# Patient Record
Sex: Female | Born: 1945
Health system: Southern US, Community
[De-identification: ages and names within clinical notes are randomized; demographics above are authoritative.]

## PROBLEM LIST (undated history)

## (undated) DIAGNOSIS — N819 Female genital prolapse, unspecified: Secondary | ICD-10-CM

## (undated) DIAGNOSIS — N816 Rectocele: Secondary | ICD-10-CM

## (undated) DIAGNOSIS — K649 Unspecified hemorrhoids: Secondary | ICD-10-CM

## (undated) DIAGNOSIS — N993 Prolapse of vaginal vault after hysterectomy: Secondary | ICD-10-CM

## (undated) DIAGNOSIS — R32 Unspecified urinary incontinence: Secondary | ICD-10-CM

## (undated) DIAGNOSIS — E7800 Pure hypercholesterolemia, unspecified: Secondary | ICD-10-CM

## (undated) HISTORY — PX: FOOT SURGERY: SHX648

## (undated) HISTORY — DX: Unspecified urinary incontinence: R32

## (undated) HISTORY — PX: FRACTURE SURGERY: SHX138

## (undated) HISTORY — PX: TONSILECTOMY, ADENOIDECTOMY, BILATERAL MYRINGOTOMY AND TUBES: SHX2538

## (undated) HISTORY — DX: Prolapse of vaginal vault after hysterectomy: N99.3

## (undated) HISTORY — DX: Unspecified hemorrhoids: K64.9

## (undated) HISTORY — DX: Female genital prolapse, unspecified: N81.9

## (undated) HISTORY — PX: ABDOMINAL HYSTERECTOMY: SHX81

## (undated) HISTORY — DX: Pure hypercholesterolemia, unspecified: E78.00

## (undated) HISTORY — DX: Rectocele: N81.6

---

## 1998-06-10 ENCOUNTER — Other Ambulatory Visit: Admission: RE | Admit: 1998-06-10 | Discharge: 1998-06-10 | Payer: Self-pay | Admitting: *Deleted

## 1999-07-20 ENCOUNTER — Other Ambulatory Visit: Admission: RE | Admit: 1999-07-20 | Discharge: 1999-07-20 | Payer: Self-pay | Admitting: *Deleted

## 2002-05-01 ENCOUNTER — Other Ambulatory Visit: Admission: RE | Admit: 2002-05-01 | Discharge: 2002-05-01 | Payer: Self-pay | Admitting: *Deleted

## 2002-05-28 ENCOUNTER — Encounter: Payer: Self-pay | Admitting: Oral Surgery

## 2002-05-28 ENCOUNTER — Ambulatory Visit (HOSPITAL_COMMUNITY): Admission: RE | Admit: 2002-05-28 | Discharge: 2002-05-28 | Payer: Self-pay | Admitting: Oral Surgery

## 2003-06-10 ENCOUNTER — Other Ambulatory Visit: Admission: RE | Admit: 2003-06-10 | Discharge: 2003-06-10 | Payer: Self-pay | Admitting: *Deleted

## 2004-07-01 ENCOUNTER — Ambulatory Visit: Payer: Self-pay | Admitting: Internal Medicine

## 2004-07-01 ENCOUNTER — Ambulatory Visit (HOSPITAL_COMMUNITY): Admission: RE | Admit: 2004-07-01 | Discharge: 2004-07-01 | Payer: Self-pay | Admitting: Internal Medicine

## 2004-07-01 HISTORY — PX: COLONOSCOPY: SHX174

## 2004-07-27 ENCOUNTER — Other Ambulatory Visit: Admission: RE | Admit: 2004-07-27 | Discharge: 2004-07-27 | Payer: Self-pay | Admitting: *Deleted

## 2005-05-22 ENCOUNTER — Ambulatory Visit (HOSPITAL_COMMUNITY): Admission: RE | Admit: 2005-05-22 | Discharge: 2005-05-22 | Payer: Self-pay | Admitting: Internal Medicine

## 2005-11-13 ENCOUNTER — Other Ambulatory Visit: Admission: RE | Admit: 2005-11-13 | Discharge: 2005-11-13 | Payer: Self-pay | Admitting: *Deleted

## 2007-05-21 ENCOUNTER — Ambulatory Visit (HOSPITAL_COMMUNITY): Admission: RE | Admit: 2007-05-21 | Discharge: 2007-05-21 | Payer: Self-pay | Admitting: Internal Medicine

## 2010-07-22 NOTE — Op Note (Signed)
NAME:  Cathy Mclean, Cathy Mclean             ACCOUNT NO.:  [id number]   MEDICAL RECORD NO.:  [id number]          PATIENT TYPE:  AMB   LOCATION:  DAY                           FACILITY:  [facility]   PHYSICIAN:  R. Roetta Sessions, M.D. DATE OF BIRTH:  [birth year]   DATE OF PROCEDURE:  07/01/2004  DATE OF DISCHARGE:                                 OPERATIVE REPORT   PROCEDURE:  Screening colonoscopy.   INDICATION FOR PROCEDURE:  The patient is a 65 year old lady who was sent  over by courtesy of Kingsley Callander. Ouida Sills, MD, for colorectal cancer screening.  She  has no family history of colorectal neoplasia.  She has no GI symptoms.  She  had a sigmoidoscopy by Dr. Ouida Sills several years ago without significant  findings.  Colonoscopy is now being done as a screening maneuver.  This  approach has been discussed with the patient at length, the potential risks,  benefits, and alternatives have been reviewed, questions answered.  She is  agreeable.  Please see documentation in the medical record.   PROCEDURE NOTE:  O2 saturation, blood pressure, pulse, and respiration were  monitored throughout the entire procedure.   CONSCIOUS SEDATION:  Versed 3.5 mg IV, Demerol 75 mg IV in divided doses.   INSTRUMENT USED:  Olympus video chip system.   FINDINGS:  Digital rectal exam revealed no abnormalities.  Endoscopic  findings:  The prep was good.   Rectum:  Examination of the rectal mucosa including a retroflexed view of  the anal verge revealed no abnormalities.   Colon:  The colonic mucosa was surveyed from the rectosigmoid junction  through the left, transverse and right colon to the area of the appendiceal  orifice, ileocecal valve and cecum.  These structures were well-seen and  photographed for the record.  From this level the scope was slowly withdrawn  and all previously-mentioned mucosal surfaces were again seen.  The patient  was noted to have left-sided diverticula.  The remainder of the colonic  mucosa  appeared normal.  The patient tolerated the procedure well and was  reacted in endoscopy.   IMPRESSION:  1.  Normal rectum.  2.  Left-sided diverticula, the remainder of the colonic mucosa appeared      normal.   RECOMMENDATIONS:  1.  Diverticulosis literature provided to [name].  2.  Repeat colonoscopy 10 years.      RMR/MEDQ  D:  07/01/2004  T:  07/01/2004  Job:  [id number]   cc:   Kingsley Callander. Ouida Sills, MD  [address]  [place]  [place] [address]  Fax: [phone]

## 2011-04-07 DIAGNOSIS — Z1231 Encounter for screening mammogram for malignant neoplasm of breast: Secondary | ICD-10-CM | POA: Diagnosis not present

## 2011-05-02 DIAGNOSIS — E785 Hyperlipidemia, unspecified: Secondary | ICD-10-CM | POA: Diagnosis not present

## 2011-05-02 DIAGNOSIS — Z79899 Other long term (current) drug therapy: Secondary | ICD-10-CM | POA: Diagnosis not present

## 2011-05-05 DIAGNOSIS — Z23 Encounter for immunization: Secondary | ICD-10-CM | POA: Diagnosis not present

## 2011-05-05 DIAGNOSIS — E785 Hyperlipidemia, unspecified: Secondary | ICD-10-CM | POA: Diagnosis not present

## 2011-06-06 DIAGNOSIS — I8310 Varicose veins of unspecified lower extremity with inflammation: Secondary | ICD-10-CM | POA: Diagnosis not present

## 2011-07-13 DIAGNOSIS — H2510 Age-related nuclear cataract, unspecified eye: Secondary | ICD-10-CM | POA: Diagnosis not present

## 2011-07-18 DIAGNOSIS — I8310 Varicose veins of unspecified lower extremity with inflammation: Secondary | ICD-10-CM | POA: Diagnosis not present

## 2011-08-31 DIAGNOSIS — R350 Frequency of micturition: Secondary | ICD-10-CM | POA: Diagnosis not present

## 2011-08-31 DIAGNOSIS — N390 Urinary tract infection, site not specified: Secondary | ICD-10-CM | POA: Diagnosis not present

## 2012-03-19 DIAGNOSIS — Z23 Encounter for immunization: Secondary | ICD-10-CM | POA: Diagnosis not present

## 2012-04-26 DIAGNOSIS — Z1231 Encounter for screening mammogram for malignant neoplasm of breast: Secondary | ICD-10-CM | POA: Diagnosis not present

## 2012-07-01 ENCOUNTER — Telehealth: Payer: Self-pay | Admitting: *Deleted

## 2012-07-01 DIAGNOSIS — N951 Menopausal and female climacteric states: Secondary | ICD-10-CM

## 2012-07-01 NOTE — Telephone Encounter (Signed)
Pt on Estradial transdermal patch 0.025 mg weekly, insurance will not cover can we give a RX for a compatible med. Pt states has taken Vivelle dot in the past.

## 2012-07-02 ENCOUNTER — Telehealth: Payer: Self-pay | Admitting: Obstetrics and Gynecology

## 2012-07-02 MED ORDER — ESTRADIOL 0.025 MG/24HR TD PTTW: 1.0000 | MEDICATED_PATCH | TRANSDERMAL | Status: DC

## 2012-07-02 NOTE — Telephone Encounter (Signed)
Verbal order given per Dr. Emelda Fear for Vivelle.dot 0.025mg 

## 2012-07-02 NOTE — Telephone Encounter (Signed)
Message routed to Dr. Emelda Fear

## 2012-07-19 DIAGNOSIS — H04129 Dry eye syndrome of unspecified lacrimal gland: Secondary | ICD-10-CM | POA: Diagnosis not present

## 2012-08-08 ENCOUNTER — Encounter: Payer: Self-pay | Admitting: *Deleted

## 2012-08-09 ENCOUNTER — Other Ambulatory Visit: Payer: Medicare Other | Admitting: Obstetrics and Gynecology

## 2012-08-21 DIAGNOSIS — L820 Inflamed seborrheic keratosis: Secondary | ICD-10-CM | POA: Diagnosis not present

## 2012-08-21 DIAGNOSIS — D1801 Hemangioma of skin and subcutaneous tissue: Secondary | ICD-10-CM | POA: Diagnosis not present

## 2012-08-21 DIAGNOSIS — L570 Actinic keratosis: Secondary | ICD-10-CM | POA: Diagnosis not present

## 2012-08-21 DIAGNOSIS — L819 Disorder of pigmentation, unspecified: Secondary | ICD-10-CM | POA: Diagnosis not present

## 2012-09-20 ENCOUNTER — Other Ambulatory Visit: Payer: Medicare Other | Admitting: Obstetrics and Gynecology

## 2012-09-27 DIAGNOSIS — E785 Hyperlipidemia, unspecified: Secondary | ICD-10-CM | POA: Diagnosis not present

## 2012-09-27 DIAGNOSIS — Z79899 Other long term (current) drug therapy: Secondary | ICD-10-CM | POA: Diagnosis not present

## 2012-10-04 DIAGNOSIS — E785 Hyperlipidemia, unspecified: Secondary | ICD-10-CM | POA: Diagnosis not present

## 2012-11-19 DIAGNOSIS — L819 Disorder of pigmentation, unspecified: Secondary | ICD-10-CM | POA: Diagnosis not present

## 2012-11-19 DIAGNOSIS — L570 Actinic keratosis: Secondary | ICD-10-CM | POA: Diagnosis not present

## 2012-11-19 DIAGNOSIS — D1801 Hemangioma of skin and subcutaneous tissue: Secondary | ICD-10-CM | POA: Diagnosis not present

## 2012-12-12 DIAGNOSIS — Z23 Encounter for immunization: Secondary | ICD-10-CM | POA: Diagnosis not present

## 2013-01-21 DIAGNOSIS — L570 Actinic keratosis: Secondary | ICD-10-CM | POA: Diagnosis not present

## 2013-01-21 DIAGNOSIS — D1801 Hemangioma of skin and subcutaneous tissue: Secondary | ICD-10-CM | POA: Diagnosis not present

## 2013-07-30 ENCOUNTER — Ambulatory Visit (INDEPENDENT_AMBULATORY_CARE_PROVIDER_SITE_OTHER): Payer: Medicare Other | Admitting: Adult Health

## 2013-07-30 ENCOUNTER — Encounter: Payer: Self-pay | Admitting: Adult Health

## 2013-07-30 VITALS — BP 146/84 | Ht 60.0 in | Wt 145.0 lb

## 2013-07-30 DIAGNOSIS — K649 Unspecified hemorrhoids: Secondary | ICD-10-CM

## 2013-07-30 DIAGNOSIS — N816 Rectocele: Secondary | ICD-10-CM

## 2013-07-30 DIAGNOSIS — R32 Unspecified urinary incontinence: Secondary | ICD-10-CM | POA: Diagnosis not present

## 2013-07-30 DIAGNOSIS — N993 Prolapse of vaginal vault after hysterectomy: Secondary | ICD-10-CM | POA: Diagnosis not present

## 2013-07-30 HISTORY — DX: Unspecified urinary incontinence: R32

## 2013-07-30 HISTORY — DX: Prolapse of vaginal vault after hysterectomy: N99.3

## 2013-07-30 HISTORY — DX: Rectocele: N81.6

## 2013-07-30 HISTORY — DX: Unspecified hemorrhoids: K64.9

## 2013-07-30 LAB — HEMOCCULT GUIAC POC 1CARD (OFFICE): FECAL OCCULT BLD: NEGATIVE

## 2013-07-30 MED ORDER — ESTRADIOL 0.1 MG/GM VA CREA: 1.0000 | TOPICAL_CREAM | Freq: Every day | VAGINAL | Status: DC

## 2013-07-30 MED ORDER — HYDROCORTISONE ACE-PRAMOXINE 1-1 % RE FOAM: 1.0000 | Freq: Two times a day (BID) | RECTAL | Status: DC

## 2013-07-30 NOTE — Patient Instructions (Signed)
Use estrace at bedtime for 2 weeks Try proctofoam  Return in 2 wesks for pessary fitting

## 2013-07-30 NOTE — Progress Notes (Signed)
Subjective:     Patient ID: Cathy Mclean, female   DOB: 1945/04/20, 68 y.o.   MRN: [id number]  HPI Cathy Mclean is a 68 year old white female in complaining of pressure in vagina with prolapse and bowel changes in shape at times and has seen some blood, has hemorrhoids.Mom died in 24-Apr-2022 of this year.She pees in her pants if has to go or coughs and sneezes.She is still on vivelle dot 0.025 mg for hot flashes and does not want to stop them, will need PA to refill.  Review of Systems See HPI Reviewed past medical,surgical, social and family history. Reviewed medications and allergies.     Objective:   Physical Exam BP 146/84  Ht 5' (1.524 m)  Wt 145 lb (65.772 kg)  BMI 28.32 kg/m2 Skin warm and dry.Pelvic: external genitalia is normal in appearance for age, vagina: red and atrophic,has prolapse, cervix and uterus are absent,adnexa: no masses or tenderness noted, has rectocele and hemorrhoids with negative hemoccult.Is tender with exam, unable to fit pessary will try estrace cream and proctofoam and bring back in 2 weeks.   Discussed trying pessary first, before thinking surgery.  Assessment:     Rectocele Hemorrhoids Vaginal vault prolapse UI-mixed    Plan:    Eat 1/2 cup real oatmeal and 1/2 cup applesauce and f1/2 cup prune juice daily Use 1 gm estrace in vagina at HS x 2 weeks, 4 sample tubes given lot [id number], exp 8/16 Rx proctofoam HC use 2-3 x daily prn Return in 2 weeks for pessary fitting with Dr Glo Herring and me

## 2013-07-31 ENCOUNTER — Telehealth: Payer: Self-pay | Admitting: Adult Health

## 2013-07-31 NOTE — Telephone Encounter (Signed)
Clarified RX for Hydrocortisone-Pramoxine foam 23J 1 applicator rectally two times daily, 1 refill prescribed by Derrek Monaco, NP on 07/30/2013 with pharmacist at [facility]. Also Vivelle.dot faxed per front office staff.

## 2013-08-12 DIAGNOSIS — H353190 Nonexudative age-related macular degeneration, unspecified eye, stage unspecified: Secondary | ICD-10-CM | POA: Diagnosis not present

## 2013-08-12 DIAGNOSIS — H1045 Other chronic allergic conjunctivitis: Secondary | ICD-10-CM | POA: Diagnosis not present

## 2013-08-12 DIAGNOSIS — H2510 Age-related nuclear cataract, unspecified eye: Secondary | ICD-10-CM | POA: Diagnosis not present

## 2013-08-13 ENCOUNTER — Ambulatory Visit: Payer: Medicare Other | Admitting: Obstetrics and Gynecology

## 2013-08-14 ENCOUNTER — Telehealth: Payer: Self-pay | Admitting: Adult Health

## 2013-08-14 NOTE — Telephone Encounter (Signed)
Pt has appt in am, not sure about pessary, but will get JVF to assess in am, she does say she feels better since using estrace cream and she is aware Medicare will no longer cover the patch.

## 2013-08-15 ENCOUNTER — Encounter: Payer: Self-pay | Admitting: Obstetrics and Gynecology

## 2013-08-15 ENCOUNTER — Ambulatory Visit (INDEPENDENT_AMBULATORY_CARE_PROVIDER_SITE_OTHER): Payer: Medicare Other | Admitting: Obstetrics and Gynecology

## 2013-08-15 VITALS — BP 130/70 | Ht 60.0 in | Wt 145.5 lb

## 2013-08-15 DIAGNOSIS — N816 Rectocele: Secondary | ICD-10-CM | POA: Diagnosis not present

## 2013-08-15 DIAGNOSIS — N8111 Cystocele, midline: Secondary | ICD-10-CM | POA: Diagnosis not present

## 2013-08-15 DIAGNOSIS — IMO0002 Reserved for concepts with insufficient information to code with codable children: Secondary | ICD-10-CM

## 2013-08-15 NOTE — Progress Notes (Signed)
Patient ID: Cathy Mclean, female   DOB: 06-08-1945, 68 y.o.   MRN: [id number]    [facility] Visit  Patient name: Cathy Mclean MRN [id number]  Date of birth: Dec 03, 1945  CC & HPI:  Cathy Mclean is a 68 y.o. female presenting today for complaints of difficulty with defecation, over last few months.  History of laparoscopic vaginal hysterectomy  ROS:  + splinting to defecate,  Pertinent History Reviewed:   Reviewed: Significant for minimal [facility]                                Surgical Hx:   , total hysterectomy Medications: Reviewed & Updated - see associated section Social History: Reviewed -  reports that she has never smoked. She has never used smokeless tobacco.  Objective Findings:  Vitals: BP 130/70  Ht 5' (1.524 m)  Wt 145 lb 8 oz (65.998 kg)  BMI 28.42 kg/m2  Physical Examination: Pelvic - normal external genitalia, vulva, vagina, cervix, uterus and adnexa, VAGINA: normal appearing vagina with normal color and discharge, no lesions, PELVIC FLOOR EXAM: rectocele small transverse defect 4 cm up post wall on rectal exam, with good apical elevation when pt tightens muscles, EXCEPTIONAL  Elevation of apex with Kegel, cystocele small, vaginal discharge - white, odorless and pt is on extrace, no loss of urine with valsalva Rectal - normal rectal, no masses, rectocele noted as above   Assessment & Plan:  A. 1. Diffuse anxiety 2.Rectocele and small cystocele   Candidate for anterior and posterior repair. Discussed procedure in detail with Medical explainer visual guide, patient desires procedure  In late July, will see pt back in 4 weeks and have [name] schedule surgery shortly after that visit.

## 2013-08-15 NOTE — Patient Instructions (Addendum)
Cystocele Repair Cystocele repair is surgery to remove a cystocele, which is a bulging, drooping area (hernia) of the bladder that extends into the vagina. This bulging occurs on the top front wall of the vagina. LET YOUR HEALTH CARE PROVIDER KNOW ABOUT:   Any allergies you have.  All medicines you are taking, including vitamins, herbs, eye drops, creams, and over-the-counter medicines.  Use of steroids (by mouth or creams).  Previous problems you or members of your family have had with the use of anesthetics.  Any blood disorders you have.  Previous surgeries you have had.  Medical conditions you have.  Possibility of pregnancy, if this applies. RISKS AND COMPLICATIONS  Generally, this is a safe procedure. However, as with any procedure, complications can occur. Possible complications include:  Excessive bleeding.  Infection.  Injury to surrounding structures.  Problems related to anesthetics. The risks will vary depending on the type of anesthetic given.  Problems with the urinary catheter after surgery, such as blockage.  Return of the cystocele. BEFORE THE PROCEDURE   Ask your health care provider about changing or stopping your regular medicines. You may need to stop taking certain medicines 1 week before surgery.  Do not eat or drink anything after midnight the night before surgery.  If you smoke, do not smoke for at least 2 weeks before the surgery.  Do not drink any alcohol for 3 days before the surgery.  Arrange for someone to drive you home after your hospital stay and to help you with activities during recovery. PROCEDURE   You will be given a medicine that makes you sleep through the procedure (general anesthetic) or a medicine injected into your spine that numbs your body below the waist (spinal or epidural anesthetic). You will be asleep or be numbed through the entire procedure.  A thin, flexible tube (Foley catheter) will be placed in your bladder to  drain urine during and after the surgery.  The surgery is performed through the vagina. The front wall of the vagina is opened up, and the muscle between the bladder and vagina is pulled up to its normal position. This is reinforced with stitches or a piece of mesh. This removes the hernia so that the top of the vagina does not fall into the opening of the vagina.  The cut on the front wall of the vagina is then closed with absorbable stitches that do not need to be removed. AFTER THE PROCEDURE   You will be taken to a recovery area where your progress will be closely watched. Your breathing, blood pressure, and pulse (vital signs) will be checked often. When you are stable, you will be taken to a regular hospital room.  You will have a catheter in place to drain your bladder. This will stay in place for 2 7 days or until your bladder is working properly on its own.  You may have gauze packing in the vagina. This will be removed 1 2 days after the surgery.  You will be given pain medicine as needed and may be given a medicine that kills germs (antibiotic).  You will likely need to stay in the hospital for 1 2 days. Document Released: 02/18/2000 Document Revised: 10/23/2012 Document Reviewed: 08/09/2012 [employer] Patient Information 2014 [employer].  

## 2013-08-16 DIAGNOSIS — IMO0002 Reserved for concepts with insufficient information to code with codable children: Secondary | ICD-10-CM | POA: Insufficient documentation

## 2013-08-18 NOTE — Progress Notes (Signed)
Surgery scheduled 

## 2013-09-08 ENCOUNTER — Encounter (HOSPITAL_COMMUNITY): Payer: Self-pay | Admitting: Pharmacy Technician

## 2013-09-12 ENCOUNTER — Ambulatory Visit (INDEPENDENT_AMBULATORY_CARE_PROVIDER_SITE_OTHER): Payer: Medicare Other | Admitting: Obstetrics and Gynecology

## 2013-09-12 ENCOUNTER — Encounter: Payer: Self-pay | Admitting: Obstetrics and Gynecology

## 2013-09-12 VITALS — BP 130/80 | Ht 60.0 in | Wt 146.0 lb

## 2013-09-12 DIAGNOSIS — N8111 Cystocele, midline: Secondary | ICD-10-CM

## 2013-09-12 DIAGNOSIS — N816 Rectocele: Secondary | ICD-10-CM | POA: Diagnosis not present

## 2013-09-12 DIAGNOSIS — IMO0002 Reserved for concepts with insufficient information to code with codable children: Secondary | ICD-10-CM

## 2013-09-12 NOTE — Progress Notes (Signed)
Patient ID: Cathy Mclean, female   DOB: 05-05-1945, 68 y.o.   MRN: [id number] Preoperative History and Physical for anterior and posterior repair  This chart was scribed by Jenne Campus, Medical Scribe, for Dr. Mallory Shirk on 09/12/13 at 10:09 AM. This chart was reviewed by Dr. Mallory Shirk for accuracy.    Cathy Mclean is a 68 y.o. G1P1 with No LMP recorded. Patient has had a hysterectomy.   Patient has complaints today of abnormal bleeding from her rectum noted on clothing upon waking up for the past 3-6 months, gradually worsening. She also states that she has also been having diaphoresis at night. Patient notes taking Miralax to help with her bowel movements. She has a bm nightly and is noticing BRB on nightgown several times recently, the amount is getting worse.  PMH:    Past Medical History  Diagnosis Date  . Elevated cholesterol   . Vaginal vault prolapse   . Rectocele 07/30/2013  . Hemorrhoids 07/30/2013  . Prolapse of vaginal vault after hysterectomy 07/30/2013  . UI (urinary incontinence) 07/30/2013    PSH:     Past Surgical History  Procedure Laterality Date  . Abdominal hysterectomy    . Fracture surgery    . Foot surgery Left   . Tonsilectomy, adenoidectomy, bilateral myringotomy and tubes      POb/GynH:      OB History   Grav Para Term Preterm Abortions TAB SAB Ect Mult Living   1 1              SH:   History  Substance Use Topics  . Smoking status: Never Smoker   . Smokeless tobacco: Never Used  . Alcohol Use: No    FH:    Family History  Problem Relation Age of Onset  . Dementia Mother   . Parkinson's disease Mother   . Dementia Father   . Asthma Father   . Heart disease Maternal Grandfather   . Tuberculosis Paternal Grandmother      Allergies:  Allergies  Allergen Reactions  . Clindamycin/Lincomycin   . Codeine     Medications:      Current outpatient prescriptions:calcium carbonate 200 MG capsule, Take 250 mg by mouth 2 (two)  times daily with a meal., Disp: , Rfl: ;  Cholecalciferol (VITAMIN D PO), Take 1 tablet by mouth daily., Disp: , Rfl: ;  estradiol (ESTRACE VAGINAL) 0.1 MG/GM vaginal cream, Place 1 Applicatorful vaginally at bedtime., Disp: 48 g, Rfl: 0;  fish oil-omega-3 fatty acids 1000 MG capsule, Take 2 g by mouth daily., Disp: , Rfl:  hydrocortisone-pramoxine (PROCTOFOAM HC) rectal foam, Place 1 applicator rectally 2 (two) times daily., Disp: 10 g, Rfl: 1;  lovastatin (MEVACOR) 40 MG tablet, Take 40 mg by mouth at bedtime., Disp: , Rfl: ;  Multiple Vitamin (MULTIVITAMIN) capsule, Take 1 capsule by mouth daily., Disp: , Rfl: ;  zinc gluconate 50 MG tablet, Take 50 mg by mouth daily., Disp: , Rfl:   Review of Systems:   Review of Systems  Constitutional: Negative for fever, chills, weight loss, malaise/fatigue and diaphoresis.  HENT: Negative for hearing loss, ear pain, nosebleeds, congestion, sore throat, neck pain, tinnitus and ear discharge.   Eyes: Negative for blurred vision, double vision, photophobia, pain, discharge and redness.  Respiratory: Negative for cough, hemoptysis, sputum production, shortness of breath, wheezing and stridor.   Cardiovascular: Negative for chest pain, palpitations, orthopnea, claudication, leg swelling and PND.  Gastrointestinal: Positive for abdominal pain. Positive for  rectal bleeding and hematochezia. Negative for heartburn, nausea, vomiting, diarrhea, constipation, and melena.  Genitourinary: Negative for dysuria, urgency, frequency, hematuria and flank pain.  Musculoskeletal: Negative for myalgias, back pain, joint pain and falls.  Skin: Negative for itching and rash.  Neurological: Negative for dizziness, tingling, tremors, sensory change, speech change, focal weakness, seizures, loss of consciousness, weakness and headaches.  Endo/Heme/Allergies: Negative for environmental allergies and polydipsia. Does not bruise/bleed easily.  Psychiatric/Behavioral: Negative for  depression, suicidal ideas, hallucinations, memory loss and substance abuse. The patient is not nervous/anxious and does not have insomnia.      PHYSICAL EXAM:  Blood pressure 130/80, height 5' (1.524 m), weight 146 lb (66.225 kg).    Vitals reviewed. Constitutional: She is oriented to person, place, and time. She appears well-developed and well-nourished.  HENT:  Head: Normocephalic and atraumatic.  Right Ear: External ear normal.  Left Ear: External ear normal.  Nose: Nose normal.  Mouth/Throat: Oropharynx is clear and moist.  Eyes: Conjunctivae and EOM are normal. Pupils are equal, round, and reactive to light. Right eye exhibits no discharge. Left eye exhibits no discharge. No scleral icterus.  Neck: Normal range of motion. Neck supple. No tracheal deviation present. No thyromegaly present.  Cardiovascular: Normal rate, regular rhythm, normal heart sounds and intact distal pulses.  Exam reveals no gallop and no friction rub.   No murmur heard. Respiratory: Effort normal and breath sounds normal. No respiratory distress. She has no wheezes. She has no rales. She exhibits no tenderness.  GI: Soft. Bowel sounds are normal. She exhibits no distension and no mass. There is tenderness. There is no rebound and no guarding. Hemoccult negative. No obvious hemorrhoids viewable externally. Genitourinary:       Vulva is normal without lesions Vagina is pink moist without discharge There is posterior laxity with rectocele, and anterior laxity, rugated vagina with estrogen effect. The anus is inspected and no fissures noted. Small hemorrhoidal skin tags present. Digitial rectal: Hemoccult NEGATIVE. Anterior loss of support c/w rectocele 2 cm above anal sphincter. External genitalia is normal with posterior bulge of rectocele present.  Musculoskeletal: Normal range of motion. She exhibits no edema and no tenderness.  Neurological: She is alert and oriented to person, place, and time. She has  normal reflexes. She displays normal reflexes. No cranial nerve deficit. She exhibits normal muscle tone. Coordination normal.  Skin: Skin is warm and dry. No rash noted. No erythema. No pallor.  Psychiatric: She has a normal mood and affect. Her behavior is normal. Judgment and thought content normal.    Labs: No results found for this or any previous visit (from the past 336 hour(s)).  EKG: No orders found for this or any previous visit.  Imaging Studies: No results found.    Assessment: Patient Active Problem List   Diagnosis Date Noted  . Cystocele 08/16/2013  . Rectocele 07/30/2013  . Hemorrhoids 07/30/2013  . Prolapse of vaginal vault after hysterectomy 07/30/2013  . UI (urinary incontinence) 07/30/2013  recurrent rectal bleeding x 3+ months  Plan: Will refer patient to gastroenterologist, Dr. Oneida Alar to follow up on patient's rectal bleeding upon waking. Will have patient return after evaluation by Dr. Oneida Alar, and reschedule Anterior and Posterior repair until pt is evaluated by Dr Oneida Alar.  Abeer Iversen V 09/12/2013 10:07 AM

## 2013-09-12 NOTE — Patient Instructions (Signed)
Surgery postponed til Dr Oneida Alar can see you re: rectal bleeding.

## 2013-09-17 ENCOUNTER — Other Ambulatory Visit: Payer: Self-pay | Admitting: Obstetrics and Gynecology

## 2013-09-17 ENCOUNTER — Telehealth: Payer: Self-pay | Admitting: Internal Medicine

## 2013-09-17 NOTE — Telephone Encounter (Signed)
Routing to [facility] 

## 2013-09-17 NOTE — Telephone Encounter (Signed)
Pt has OV with [facility] tomorrow at 230. She was referred by [facility] for rectal bleeding and has surgery scheduled with Dr Glo Herring on 7/21. Dr Glo Herring would like for someone to call him soon after her OV with [place].

## 2013-09-17 NOTE — Patient Instructions (Signed)
Cathy Mclean  09/17/2013   Your procedure is scheduled on:  09/23/13  Report to [facility] at [place] [facility].  Call this number if you have problems the morning of surgery: [phone]   Remember:   Do not eat food or drink liquids after midnight.   Take these medicines the morning of surgery with A SIP OF WATER: none   Do not wear jewelry, make-up or nail polish.  Do not wear lotions, powders, or perfumes. You may wear deodorant.  Do not shave 48 hours prior to surgery. Men may shave face and neck.  Do not bring valuables to the hospital.  [facility] is not responsible                  for any belongings or valuables.               Contacts, dentures or bridgework may not be worn into surgery.  Leave suitcase in the car. After surgery it may be brought to your room.  For patients admitted to the hospital, discharge time is determined by your                treatment team.               Patients discharged the day of surgery will not be allowed to drive  home.  Name and phone number of your driver: family  Special Instructions: Shower using CHG 2 nights before surgery and the night before surgery.  If you shower the day of surgery use CHG.  Use special wash - you have one bottle of CHG for all showers.  You should use approximately 1/3 of the bottle for each shower.   Please read over the following fact sheets that you were given: Anesthesia Post-op Instructions and Care and Recovery After Surgery   PATIENT INSTRUCTIONS POST-ANESTHESIA  IMMEDIATELY FOLLOWING SURGERY:  Do not drive or operate machinery for the first twenty four hours after surgery.  Do not make any important decisions for twenty four hours after surgery or while taking narcotic pain medications or sedatives.  If you develop intractable nausea and vomiting or a severe headache please notify your doctor immediately.  FOLLOW-UP:  Please make an appointment with your surgeon as instructed. You do not need to follow up with  anesthesia unless specifically instructed to do so.  WOUND CARE INSTRUCTIONS (if applicable):  Keep a dry clean dressing on the anesthesia/puncture wound site if there is drainage.  Once the wound has quit draining you may leave it open to air.  Generally you should leave the bandage intact for twenty four hours unless there is drainage.  If the epidural site drains for more than 36-48 hours please call the anesthesia department.  QUESTIONS?:  Please feel free to call your physician or the hospital operator if you have any questions, and they will be happy to assist you.      Anterior and Posterior Colporrhaphy Anterior or posterior colporrhaphy is surgery to fix a prolapse of organs in the genital tract. Prolapse means the falling down, bulging, dropping, or drooping of an organ. Organs that commonly prolapse include the rectum, bladder, vagina, and uterus. Prolapse can affect a single organ or several organs at the same time. This often worsens when women stop having their monthly periods (menopause) because estrogen loss weakens the muscles and tissues in the genital tract. In addition, prolapse happens when the organs are damaged or weakened. This commonly happens after childbirth  and as a result of aging. Surgery is often done for severe prolapses.  The type of colporrhaphy done depends on the type of genital prolapse. Types of genital prolapse include the following:   Cystocele. This is a prolapse of the upper (anterior) wall of the vagina. The anterior wall bulges into the vagina and brings the bladder with it.   Rectocele. This is a prolapse of the lower (posterior) wall of the vagina. The posterior vaginal wall bulges into the vagina and brings the rectum with it.   Enterocele. This is a prolapse of part of the pelvic organs called the pouch of Douglas. It also involves a portion of the small bowel. It appears as a bulge under the neck of the uterus at the top of the back wall of the  vagina.   Procidentia. This is a complete prolapse of the uterus and the cervix. The prolapse can be seen and felt coming out of the vagina. LET [facility] CARE PROVIDER KNOW ABOUT:   Any allergies you have.   All medicines you are taking, including vitamins, herbs, eye drops, creams, and over-the-counter medicines.   Previous problems you or members of your family have had with the use of anesthetics.   Any blood disorders you have.   Previous surgeries you have had.   Medical conditions you have.   Smoking history or history of alcohol use.   Possibility of pregnancy, if this applies.  RISKS AND COMPLICATIONS Generally, anterior or posterior colporrhaphy is a safe procedure. However, as with any procedure, complications can occur. Possible complications include:   Infection.   Damage to other organs during surgery.   Bleeding after surgery.   Problems urinating.   Problems from the anesthetic.  BEFORE THE PROCEDURE  Ask your health care provider about changing or stopping your regular medicines.   Do not eat or drink anything for at least 8 hours before the surgery.   If you smoke, do not smoke for at least 2 weeks before the surgery.   Make plans to have someone drive you home after your hospital stay. Also, arrange for someone to help you with activities during recovery. PROCEDURE  You may be given medicine to help you relax before the surgery (sedative). During the surgery you will be given medicine to make you sleep through the procedure (general anesthetic) or medicine to numb you from the waist down (spinal anesthetic). This medicine will be given through an intravenous (IV) access tube that is put into one of your veins.  The procedure will vary depending on the type of repair:   Anterior repair. A cut (incision) is made in the midline section of the front part of the vaginal wall. A triangular-shaped piece of vaginal tissue is removed, and the  stronger, healthier tissue is sewn together in order to support and suspend the bladder.   Posterior repair. An incision is made midline on the back wall of the vagina. A triangular portion of vaginal skin is removed to expose the muscle. Excess tissue is removed, and stronger, healthier muscle and ligament tissue is sewn together to support the rectum.   Anterior and posterior repair. Both procedures are done during the same surgery. AFTER THE PROCEDURE You will be taken to a recovery area. Your blood pressure, pulse, breathing, and temperature (vital signs) will be monitored. This is done until you are stable. Then you will be transferred to a hospital room.  After surgery, you will have a small rubber tube  in place to drain your bladder (urinary catheter). This will be in place for 2 to 7 days or until your bladder is working properly on its own. The IV access tube will be removed in 1 to 3 days. You may have a gauze packing in your vagina to prevent bleeding. This will be removed 2 or 3 days after the surgery. You will likely need to stay in the hospital for 3 to 5 days.  Document Released: 05/13/2003 Document Revised: 10/23/2012 Document Reviewed: 07/12/2012 [facility] Patient Information 2015 [place], [place]. This information is not intended to replace advice given to you by your health care provider. Make sure you discuss any questions you have with your health care provider.

## 2013-09-18 ENCOUNTER — Encounter (HOSPITAL_COMMUNITY): Payer: Self-pay

## 2013-09-18 ENCOUNTER — Encounter (HOSPITAL_COMMUNITY)
Admission: RE | Admit: 2013-09-18 | Discharge: 2013-09-18 | Disposition: A | Discharge status: Home / Self Care | Payer: Medicare Other | Source: Ambulatory Visit | Attending: Obstetrics and Gynecology | Admitting: Obstetrics and Gynecology

## 2013-09-18 ENCOUNTER — Encounter: Payer: Self-pay | Admitting: Gastroenterology

## 2013-09-18 ENCOUNTER — Other Ambulatory Visit: Payer: Self-pay

## 2013-09-18 ENCOUNTER — Ambulatory Visit (INDEPENDENT_AMBULATORY_CARE_PROVIDER_SITE_OTHER): Payer: Medicare Other | Admitting: Gastroenterology

## 2013-09-18 VITALS — BP 132/74 | HR 67 | Temp 97.2°F | Ht 60.0 in | Wt 148.2 lb

## 2013-09-18 DIAGNOSIS — K625 Hemorrhage of anus and rectum: Secondary | ICD-10-CM | POA: Insufficient documentation

## 2013-09-18 DIAGNOSIS — Z01812 Encounter for preprocedural laboratory examination: Secondary | ICD-10-CM | POA: Insufficient documentation

## 2013-09-18 LAB — CBC
HCT: 42.5 % (ref 36.0–46.0)
Hemoglobin: 14.2 g/dL (ref 12.0–15.0)
MCH: 29.9 pg (ref 26.0–34.0)
MCHC: 33.4 g/dL (ref 30.0–36.0)
MCV: 89.5 fL (ref 78.0–100.0)
PLATELETS: 197 10*3/uL (ref 150–400)
RBC: 4.75 MIL/uL (ref 3.87–5.11)
RDW: 13 % (ref 11.5–15.5)
WBC: 6.6 10*3/uL (ref 4.0–10.5)

## 2013-09-18 LAB — COMPREHENSIVE METABOLIC PANEL
ALT: 19 U/L (ref 0–35)
AST: 22 U/L (ref 0–37)
Albumin: 3.6 g/dL (ref 3.5–5.2)
Alkaline Phosphatase: 66 U/L (ref 39–117)
Anion gap: 12 (ref 5–15)
BILIRUBIN TOTAL: 0.2 mg/dL — AB (ref 0.3–1.2)
BUN: 19 mg/dL (ref 6–23)
CHLORIDE: 99 meq/L (ref 96–112)
CO2: 29 meq/L (ref 19–32)
CREATININE: 0.73 mg/dL (ref 0.50–1.10)
Calcium: 9.3 mg/dL (ref 8.4–10.5)
GFR calc non Af Amer: 86 mL/min — ABNORMAL LOW (ref >90)
GLUCOSE: 191 mg/dL — AB (ref 70–99)
Potassium: 4.7 mEq/L (ref 3.7–5.3)
Sodium: 140 mEq/L (ref 137–147)
Total Protein: 6.8 g/dL (ref 6.0–8.3)

## 2013-09-18 LAB — URINALYSIS, ROUTINE W REFLEX MICROSCOPIC
Bilirubin Urine: NEGATIVE
GLUCOSE, UA: NEGATIVE mg/dL
KETONES UR: NEGATIVE mg/dL
Leukocytes, UA: NEGATIVE
Nitrite: NEGATIVE
Protein, ur: NEGATIVE mg/dL
Specific Gravity, Urine: >1.03 — ABNORMAL HIGH (ref 1.005–1.030)
UROBILINOGEN UA: 0.2 mg/dL (ref 0.0–1.0)
pH: 5.5 (ref 5.0–8.0)

## 2013-09-18 LAB — URINE MICROSCOPIC-ADD ON

## 2013-09-18 MED ORDER — PEG-KCL-NACL-NASULF-NA ASC-C 100 G PO SOLR: 1.0000 | ORAL | Status: DC

## 2013-09-18 NOTE — Telephone Encounter (Signed)
Addressed.

## 2013-09-18 NOTE — Patient Instructions (Signed)
1. Colonoscopy with possible hemorrhoid banding as scheduled. See separate instructions.  

## 2013-09-18 NOTE — Progress Notes (Signed)
Primary Care Physician:  Asencion Noble, MD  Primary Gastroenterologist:  Barney Drain, MD   Chief Complaint  Patient presents with  . Rectal Bleeding    HPI:  Cathy Mclean is a 68 y.o. female here at the request of Dr. Glo Herring for further evaluation of rectal bleeding. Patient is scheduled for an anterior and posterior repair next week but she voiced complaints of rectal bleeding. He wants rectal bleeding evaluated prior to proceeding with surgery.  Patient's last colonoscopy was in 2006 at which time she had left sided diverticula, normal rectum. Patient tells me that she has prolapsing from her vagina related to her cystocele/rectocele. She has difficulty defecating. Recently started on MiraLax, now having [place] 4 stools every other day with less straining. She reports bright red blood per rectum each time she has a bowel movement. She often wakes up in the morning with blood in her underwear. Some rectal pain especially after bowel movement. Denies any weight loss. Denies significant upper GI symptoms.   Current Outpatient Prescriptions  Medication Sig Dispense Refill  . calcium carbonate 200 MG capsule Take 250 mg by mouth 2 (two) times daily with a meal.      . Cholecalciferol (VITAMIN D PO) Take 1 tablet by mouth daily.      Marland Kitchen estradiol (ESTRACE VAGINAL) 0.1 MG/GM vaginal cream Place 1 Applicatorful vaginally at bedtime.  48 g  0  . fish oil-omega-3 fatty acids 1000 MG capsule Take 2 g by mouth daily.      . hydrocortisone-pramoxine (PROCTOFOAM HC) rectal foam Place 1 applicator rectally 2 (two) times daily.  10 g  1  . lovastatin (MEVACOR) 40 MG tablet Take 40 mg by mouth at bedtime.      . Multiple Vitamin (MULTIVITAMIN) capsule Take 1 capsule by mouth daily.      . polyethylene glycol powder (GLYCOLAX/MIRALAX) powder Take 1 Container by mouth once.      . zinc gluconate 50 MG tablet Take 50 mg by mouth daily.       No current facility-administered medications for this visit.     Allergies as of 09/18/2013 - Review Complete 09/18/2013  Allergen Reaction Noted  . Clindamycin/lincomycin  08/08/2012  . Codeine  08/08/2012    Past Medical History  Diagnosis Date  . Elevated cholesterol   . Vaginal vault prolapse   . Rectocele 07/30/2013  . Hemorrhoids 07/30/2013  . Prolapse of vaginal vault after hysterectomy 07/30/2013  . UI (urinary incontinence) 07/30/2013    Past Surgical History  Procedure Laterality Date  . Abdominal hysterectomy    . Fracture surgery    . Foot surgery Left   . Tonsilectomy, adenoidectomy, bilateral myringotomy and tubes    . Colonoscopy  07/01/2004    Dr. Gala Romney- normal rectum, L sided diverticula, the remainder of the colonic mucosa appeared normal.    Family History  Problem Relation Age of Onset  . Dementia Mother   . Parkinson's disease Mother   . Dementia Father   . Asthma Father   . Heart disease Maternal Grandfather   . Tuberculosis Paternal Grandmother     History   Social History  . Marital Status: Married    Spouse Name: N/A    Number of Children: N/A  . Years of Education: N/A   Occupational History  . Not on file.   Social History Main Topics  . Smoking status: Never Smoker   . Smokeless tobacco: Never Used     Comment: Never smoked  .  Alcohol Use: No  . Drug Use: No  . Sexual Activity: Yes   Other Topics Concern  . Not on file   Social History Narrative  . No narrative on file      ROS:  General: Negative for anorexia, weight loss, fever, chills, fatigue, weakness. Eyes: Negative for vision changes.  ENT: Negative for hoarseness, difficulty swallowing , nasal congestion. CV: Negative for chest pain, angina, palpitations, dyspnea on exertion, peripheral edema.  Respiratory: Negative for dyspnea at rest, dyspnea on exertion, cough, sputum, wheezing.  GI: See history of present illness. GU:  Negative for dysuria, hematuria, urinary incontinence, urinary frequency, nocturnal urination. See  history of present illness MS: Negative for joint pain, low back pain.  Derm: Negative for rash or itching.  Neuro: Negative for weakness, abnormal sensation, seizure, frequent headaches, memory loss, confusion.  Psych: Negative for anxiety, depression, suicidal ideation, hallucinations.  Endo: Negative for unusual weight change.  Heme: Negative for bruising or bleeding. Allergy: Negative for rash or hives.    Physical Examination:  BP 132/74  Pulse 67  Temp(Src) 97.2 F (36.2 C) (Oral)  Ht 5' (1.524 m)  Wt 148 lb 3.2 oz (67.223 kg)  BMI 28.94 kg/m2   General: Well-nourished, well-developed in no acute distress.  Head: Normocephalic, atraumatic.   Eyes: Conjunctiva pink, no icterus. Mouth: Oropharyngeal mucosa moist and pink , no lesions erythema or exudate. Neck: Supple without thyromegaly, masses, or lymphadenopathy.  Lungs: Clear to auscultation bilaterally.  Heart: Regular rate and rhythm, no murmurs rubs or gallops.  Abdomen: Bowel sounds are normal, nontender, nondistended, no hepatosplenomegaly or masses, no abdominal bruits or    hernia , no rebound or guarding.   Rectal: No evidence of rectal prolapse. No external hemorrhoids. DRE minimally tender, no stool present. Extremities: No lower extremity edema. No clubbing or deformities.  Neuro: Alert and oriented x 4 , grossly normal neurologically.  Skin: Warm and dry, no rash or jaundice.   Psych: Alert and cooperative, normal mood and affect.  Labs: Lab Results  Component Value Date   CREATININE 0.73 09/18/2013   BUN 19 09/18/2013   NA 140 09/18/2013   K 4.7 09/18/2013   CL 99 09/18/2013   CO2 29 09/18/2013   Lab Results  Component Value Date   ALT 19 09/18/2013   AST 22 09/18/2013   ALKPHOS 66 09/18/2013   BILITOT 0.2* 09/18/2013   Lab Results  Component Value Date   WBC 6.6 09/18/2013   HGB 14.2 09/18/2013   HCT 42.5 09/18/2013   MCV 89.5 09/18/2013   PLT 197 09/18/2013     Imaging Studies: No results  found.

## 2013-09-19 ENCOUNTER — Telehealth: Payer: Self-pay | Admitting: Gastroenterology

## 2013-09-19 ENCOUNTER — Encounter: Payer: Self-pay | Admitting: Obstetrics and Gynecology

## 2013-09-19 ENCOUNTER — Encounter: Payer: Self-pay | Admitting: Gastroenterology

## 2013-09-19 NOTE — Assessment & Plan Note (Signed)
68 year old lady with rectal bleeding after bowel movements and at other times likely due to a benign anorectal source. However last colonoscopy was nearly 10 years ago. Prior to undergoing anterior and posterior repair, recommend she pursue colonoscopy for diagnostic purposes. If she has been able hemorrhoids this can be done at the same time. Discussed with Dr. Oneida Alar and Dr. Glo Herring. Patient will need wait at least 7 days after her colonoscopy with hemorrhoid banding before proceeding with anterior and posterior repair. Patient was disappointed with having to postpone her surgery but understood.  I have discussed the risks, alternatives, benefits with regards to but not limited to the risk of reaction to medication, bleeding, infection, perforation and the patient is agreeable to proceed. Written consent to be obtained.

## 2013-09-19 NOTE — Telephone Encounter (Signed)
Please let Dr. Johnnye Sima office know that she is scheduled for colonoscopy 09/22/13 and will need to reschedule her anterior and posterior repair (scheduled 09/23/13). Needs to wait 7 days after her colonoscopy per Dr. Oneida Alar. I spoke with Dr. Glo Herring about this yesterday and he was in agreement.

## 2013-09-19 NOTE — Progress Notes (Unsigned)
Patient ID: Cathy Mclean, female   DOB: 02-28-1946, 68 y.o.   MRN: [id number] PATIENT CALLED, HAD BEEN SCHEDULED FOR SURGERY, SHE HAS CANCELLED SURGERY PER LESLIE LEWIS, PA ADVICE ( LESLIE STATE SHE HAD TALKED WITH DR. Glo Herring ABOUT THIS).  PT JUST WANTED TO MAKE SURE DR. FERGUSON GOT THIS MESSAGE.  THE POST OP APPT HAS BEEN CANCELLED ALSO.   [name]

## 2013-09-22 ENCOUNTER — Telehealth: Payer: Self-pay | Admitting: Obstetrics and Gynecology

## 2013-09-22 ENCOUNTER — Encounter (HOSPITAL_COMMUNITY): Payer: Self-pay | Admitting: *Deleted

## 2013-09-22 ENCOUNTER — Ambulatory Visit (HOSPITAL_COMMUNITY)
Admission: RE | Admit: 2013-09-22 | Discharge: 2013-09-22 | Disposition: A | Discharge status: Home / Self Care | Payer: Medicare Other | Source: Ambulatory Visit | Attending: Gastroenterology | Admitting: Gastroenterology

## 2013-09-22 ENCOUNTER — Encounter (HOSPITAL_COMMUNITY)
Admission: RE | Disposition: A | Discharge status: Home / Self Care | Payer: Self-pay | Source: Ambulatory Visit | Attending: Gastroenterology

## 2013-09-22 DIAGNOSIS — K648 Other hemorrhoids: Secondary | ICD-10-CM

## 2013-09-22 DIAGNOSIS — Z90710 Acquired absence of both cervix and uterus: Secondary | ICD-10-CM | POA: Insufficient documentation

## 2013-09-22 DIAGNOSIS — K5730 Diverticulosis of large intestine without perforation or abscess without bleeding: Secondary | ICD-10-CM | POA: Diagnosis not present

## 2013-09-22 DIAGNOSIS — E7800 Pure hypercholesterolemia, unspecified: Secondary | ICD-10-CM | POA: Insufficient documentation

## 2013-09-22 DIAGNOSIS — Q438 Other specified congenital malformations of intestine: Secondary | ICD-10-CM | POA: Diagnosis not present

## 2013-09-22 DIAGNOSIS — K625 Hemorrhage of anus and rectum: Secondary | ICD-10-CM

## 2013-09-22 HISTORY — PX: HEMORRHOID BANDING: SHX5850

## 2013-09-22 HISTORY — PX: COLONOSCOPY: SHX5424

## 2013-09-22 SURGERY — COLONOSCOPY
Anesthesia: Moderate Sedation

## 2013-09-22 MED ORDER — MEPERIDINE HCL 100 MG/ML IJ SOLN
INTRAMUSCULAR | Status: DC | PRN
  Administered 2013-09-22 (×3): 25 mg via INTRAVENOUS

## 2013-09-22 MED ORDER — MIDAZOLAM HCL 5 MG/5ML IJ SOLN
INTRAMUSCULAR | Status: AC
  Filled 2013-09-22: qty 10

## 2013-09-22 MED ORDER — SODIUM CHLORIDE 0.9 % IV SOLN
INTRAVENOUS | Status: DC
  Administered 2013-09-22: 1000 mL via INTRAVENOUS

## 2013-09-22 MED ORDER — STERILE WATER FOR IRRIGATION IR SOLN
Status: DC | PRN
  Administered 2013-09-22: 13:00:00

## 2013-09-22 MED ORDER — MIDAZOLAM HCL 5 MG/5ML IJ SOLN
INTRAMUSCULAR | Status: DC | PRN
  Administered 2013-09-22 (×3): 2 mg via INTRAVENOUS

## 2013-09-22 MED ORDER — MEPERIDINE HCL 100 MG/ML IJ SOLN
INTRAMUSCULAR | Status: AC
  Filled 2013-09-22: qty 2

## 2013-09-22 NOTE — Telephone Encounter (Signed)
Called and told Cathy Mclean in scheduling at Dr. Johnnye Sima.

## 2013-09-22 NOTE — H&P (Signed)
Primary Care Physician:  Carylon Perches, MD Primary Gastroenterologist:  Dr. Darrick Penna  Pre-Procedure History & Physical: HPI:  Cathy Mclean is a 67 y.o. female here for  BRBPR.  Past Medical History  Diagnosis Date  . Elevated cholesterol   . Vaginal vault prolapse   . Rectocele 07/30/2013  . Hemorrhoids 07/30/2013  . Prolapse of vaginal vault after hysterectomy 07/30/2013  . UI (urinary incontinence) 07/30/2013    Past Surgical History  Procedure Laterality Date  . Abdominal hysterectomy    . Fracture surgery    . Foot surgery Left   . Tonsilectomy, adenoidectomy, bilateral myringotomy and tubes    . Colonoscopy  07/01/2004    Dr. Jena Gauss- normal rectum, L sided diverticula, the remainder of the colonic mucosa appeared normal.    Prior to Admission medications   Medication Sig Start Date End Date Taking? Authorizing Provider  calcium carbonate 200 MG capsule Take 250 mg by mouth 2 (two) times daily with a meal.   Yes Historical Provider, MD  Cholecalciferol (VITAMIN D PO) Take 1 tablet by mouth daily.   Yes Historical Provider, MD  estradiol (ESTRACE VAGINAL) 0.1 MG/GM vaginal cream Place 1 Applicatorful vaginally at bedtime. 07/30/13  Yes Adline Potter, NP  fish oil-omega-3 fatty acids 1000 MG capsule Take 2 g by mouth daily.   Yes Historical Provider, MD  hydrocortisone-pramoxine (PROCTOFOAM HC) rectal foam Place 1 applicator rectally 2 (two) times daily. 07/30/13  Yes Adline Potter, NP  lovastatin (MEVACOR) 40 MG tablet Take 40 mg by mouth at bedtime.   Yes Historical Provider, MD  Multiple Vitamin (MULTIVITAMIN) capsule Take 1 capsule by mouth daily.   Yes Historical Provider, MD  peg 3350 powder (MOVIPREP) 100 G SOLR Take 1 kit (200 g total) by mouth as directed. 09/18/13  Yes West Bali, MD  polyethylene glycol powder (GLYCOLAX/MIRALAX) powder Take 1 Container by mouth once.   Yes Historical Provider, MD  zinc gluconate 50 MG tablet Take 50 mg by mouth daily.   Yes  Historical Provider, MD    Allergies as of 09/18/2013 - Review Complete 09/18/2013  Allergen Reaction Noted  . Clindamycin/lincomycin  08/08/2012  . Codeine  08/08/2012    Family History  Problem Relation Age of Onset  . Dementia Mother   . Parkinson's disease Mother   . Dementia Father   . Asthma Father   . Heart disease Maternal Grandfather   . Tuberculosis Paternal Grandmother     History   Social History  . Marital Status: Married    Spouse Name: N/A    Number of Children: N/A  . Years of Education: N/A   Occupational History  . Not on file.   Social History Main Topics  . Smoking status: Never Smoker   . Smokeless tobacco: Never Used     Comment: Never smoked  . Alcohol Use: No  . Drug Use: No  . Sexual Activity: Yes   Other Topics Concern  . Not on file   Social History Narrative  . No narrative on file    Review of Systems: See HPI, otherwise negative ROS   Physical Exam: BP 131/86  Temp(Src) 97.9 F (36.6 C) (Oral)  Resp 20  Ht 5' (1.524 m)  Wt 148 lb (67.132 kg)  BMI 28.90 kg/m2  SpO2 94% General:   Alert,  pleasant and cooperative in NAD Head:  Normocephalic and atraumatic. Neck:  Supple; Lungs:  Clear throughout to auscultation.    Heart:  Regular rate  and rhythm. Abdomen:  Soft, nontender and nondistended. Normal bowel sounds, without guarding, and without rebound.   Neurologic:  Alert and  oriented x4;  grossly normal neurologically.  Impression/Plan:    BRBPR  PLAN: TCS?HEMORRHOID BANDING TODAY

## 2013-09-22 NOTE — Discharge Instructions (Signed)
Your RECTAL BLEEDING IS DUE TO  have internal hemorrhoids.  I PLACED 3 BAND TO TREAT YOUR HEMORRHOIDAL BLEEDING. YOU DID NOT HAVE ANY POLYPS. YOU MAY SOME MILD BLEEDING OVER THE NEXT 3 TO 5 DAYS. YOU HAVE DIVERTICULOSIS IN YOUR LEFT COLON AND EXTERNAL HEMORRHOIDS AS WELL.   CALL [phone] IF YOU HAVE A FEVER, A LARGE AMOUNT OF BLEEDING, OR DIFFICULTY URINATING.  [place]. AVOID CONSTIPATION. DRINK WATER TO KEEP URINE LIGHT YELLOW. YOU MAY USE NAPROXEN TWICE DAILY FOR RECTAL DISCOMFORT. TYLENOL AS NEED FOR ADDITIONAL PAIN RELIEF. IF NEEDED USE, COLACE TWICE DAILY TO SOFTEN STOOL. USE PREPARATION H OR PROCTO-CREAM AS NEEDED FOR RECTAL PAIN OR DISCOMFORT.  FOLLOW A LOW RESIDUE DIET FOR THE NEXT 2 WEEKS. SEE INFO BELOW.  FOLLOW UP IN 3 WEEKS.  Next colonoscopy in 10-15 years IF THE BENEFITS OUTWEIGH THE RISKS. WE DO NOT PERFORM ROUTINE SCREENING COLONOSCOPY AFTER THE AGE OF 75.    Colonoscopy Care After Read the instructions outlined below and refer to this sheet in the next week. These discharge instructions provide you with general information on caring for yourself after you leave the hospital. While your treatment has been planned according to the most current medical practices available, unavoidable complications occasionally occur. If you have any problems or questions after discharge, call DR. Zoraida Havrilla, [phone].  ACTIVITY  You may resume your regular activity, but move at a slower pace for the next 24 hours.   Take frequent rest periods for the next 24 hours.   Walking will help get rid of the air and reduce the bloated feeling in your belly (abdomen).   No driving for 24 hours (because of the medicine (anesthesia) used during the test).   You may shower.   Do not sign any important legal documents or operate any machinery for 24 hours (because of the anesthesia used during the test).    NUTRITION  Drink plenty of fluids.   You may resume your normal diet as  instructed by your doctor.   Begin with a light meal and progress to your normal diet. Heavy or fried foods are harder to digest and may make you feel sick to your stomach (nauseated).   Avoid alcoholic beverages for 24 hours or as instructed.    MEDICATIONS  You may resume your normal medications.   WHAT YOU CAN EXPECT TODAY  Some feelings of bloating in the abdomen.   Passage of more gas than usual.   Spotting of blood in your stool or on the toilet paper  .  IF YOU HAD POLYPS REMOVED DURING THE COLONOSCOPY:  Eat a soft diet IF YOU HAVE NAUSEA, BLOATING, ABDOMINAL PAIN, OR VOMITING.    FINDING OUT THE RESULTS OF YOUR TEST Not all test results are available during your visit. DR. Oneida Alar WILL CALL YOU WITHIN 7 DAYS OF YOUR PROCEDUE WITH YOUR RESULTS. Do not assume everything is normal if you have not heard from DR. Velinda Wrobel IN ONE WEEK, CALL HER OFFICE AT [phone].  SEEK IMMEDIATE MEDICAL ATTENTION AND CALL THE OFFICE: [phone] IF:  You have more than a spotting of blood in your stool.   Your belly is swollen (abdominal distention).   You are nauseated or vomiting.   You have a temperature over 101F.   You have abdominal pain or discomfort that is severe or gets worse throughout the day.   Hemorrhoids Hemorrhoids are dilated (enlarged) veins around the rectum. Sometimes clots will form in the veins. This makes them swollen  and painful. These are called thrombosed hemorrhoids. Causes of hemorrhoids include:  Constipation.   Straining to have a bowel movement.   HEAVY LIFTING HOME CARE INSTRUCTIONS  Eat a well balanced diet and drink 6 to 8 glasses of water every day to avoid constipation. You may also use a bulk laxative.   Avoid straining to have bowel movements.   Keep anal area dry and clean.   Do not use a donut shaped pillow or sit on the toilet for long periods. This increases blood pooling and pain.   Move your bowels when your body has the  urge; this will require less straining and will decrease pain and pressure.  LOW RESIDUE DIET  A low-residue diet, aka low-fiber diet, is usually recommended. An intake of less than 10 grams of fiber per day is generally considered a low-residue diet.  LOW RESIDUE Diet  Grain Products: enriched refined white bread, buns, bagels, English muffins  plain cereals e.g. Cheerios, Cornflakes, Cream of Wheat, Rice Krispies, Special K  arrowroot cookies, tea biscuits, soda crackers, plain melba toast  white rice, refined pasta and noodles  avoid whole grains   Fruits: fruit juices except prune juice  applesauce, apricots, banana (1/2), cantaloupe, canned fruit cocktail, grapes, honeydew melon, peaches, watermelon  avoid raw and dried fruits, and berries.   Vegetables: vegetable juices  potatoes (no Skin)  alfalfa sprouts, beets, green/yellow beans, carrots, celery, cucumber, eggplant, lettuce, mushrooms, green/red peppers, potatoes (peeled), squash, zucchini  avoid vegetables from the cruciferous family such as broccoli, cauliflower, brussels sprouts, cabbage, kale, Swiss chard, onion, etc   Meat and Protein Choice: well-cooked, tender meat, fish and eggs  avoid beans  avoid all nuts and seeds, as well as foods that may contain seeds (such as yogurt)   Dairy: NO RESTRICTIONS   Drinks: juices  tea and coffee   avoid alcohol

## 2013-09-22 NOTE — Progress Notes (Signed)
REVIEWED.  

## 2013-09-22 NOTE — Telephone Encounter (Signed)
[name] has her colonoscopy today, and it is recommended that she recover from that before undergoing an elective surgical procedure. We will postpone A & P repair at least a week. Wll have amy barnes call pt to reschedule

## 2013-09-23 NOTE — Op Note (Signed)
[facility] [address] [place], [address]   COLONOSCOPY PROCEDURE REPORT  PATIENT: Rochella, Benner  MR#: [id number] BIRTHDATE: 12/24/1945 , 89  yrs. old GENDER: Female ENDOSCOPIST: Barney Drain, MD REFERRED Twanna Hy, M.D.  Mallory Shirk, M.D. PROCEDURE DATE:  09/22/2013 PROCEDURE:   Hemorrhoidectomy via banding, clips or ligation INDICATIONS:Rectal Bleeding. MEDICATIONS: Demerol 75 mg IV and Versed 6 mg IV  DESCRIPTION OF PROCEDURE:    Physical exam was performed.  Informed consent was obtained from the patient after explaining the benefits, risks, and alternatives to procedure.  The patient was connected to monitor and placed in left lateral position. Continuous oxygen was provided by nasal cannula and IV medicine administered through an indwelling cannula.  After administration of sedation and rectal exam, the patients rectum was intubated and the EC-[id number] ([id number]) and [id number] ([id number])  colonoscope was advanced under direct visualization to the ileum.  The scope was removed slowly by carefully examining the color, texture, anatomy, and integrity mucosa on the way out.  The patient was recovered in endoscopy and discharged home in satisfactory condition.    COLON FINDINGS: The mucosa appeared normal in the terminal ileum.  , There was moderate diverticulosis noted in the descending colon and sigmoid colon with associated muscular hypertrophy and tortuosity. NO POLYPS. Moderate sized internal hemorrhoids were found. 3 BANDS APPLIED.  PREP QUALITY: good. CECAL W/D TIME: 12 minutes     COMPLICATIONS: None  ENDOSCOPIC IMPRESSION: 1.   Normal mucosa in the terminal ileum 2.   Moderate diverticulosis in the descending colon and sigmoid colon 3.   Moderate sized internal hemorrhoids  RECOMMENDATIONS: CALL [phone] IF YOU HAVE A FEVER, A LARGE AMOUNT OF BLEEDING, OR DIFFICULTY URINATING.  [place]. AVOID CONSTIPATION. DRINK WATER TO  KEEP URINE LIGHT YELLOW. MAY USE NAPROXEN TWICE DAILY FOR RECTAL DISCOMFORT.  TYLENOL AS NEED FOR ADDITIONAL PAIN RELIEF. IF NEEDED USE COLACE TWICE DAILY TO SOFTEN STOOL. USE PREPARATION H OR PROCTO-CREAM AS NEEDED FOR RECTAL PAIN OR DISCOMFORT. FOLLOW A LOW RESIDUE DIET FOR THE NEXT 2 WEEKS. FOLLOW UP IN 3 WEEKS.  Next colonoscopy in 10-15 years IF THE BENEFITS OUTWEIGH THE RISKS. NO ROUTINE SCREENING COLONOSCOPY NEEDED AFTER THE AGE OF 75.  [place].    _______________________________ eSignedBarney Drain, MD 09/23/2013 10:31 AM

## 2013-09-25 ENCOUNTER — Encounter (HOSPITAL_COMMUNITY): Payer: Medicare Other | Attending: Obstetrics and Gynecology

## 2013-09-26 ENCOUNTER — Encounter (HOSPITAL_COMMUNITY): Payer: Self-pay | Admitting: Gastroenterology

## 2013-09-30 ENCOUNTER — Encounter: Payer: Medicare Other | Admitting: Obstetrics and Gynecology

## 2013-09-30 NOTE — Progress Notes (Signed)
cc'd to pcp 

## 2013-10-01 ENCOUNTER — Inpatient Hospital Stay (HOSPITAL_COMMUNITY): Payer: Medicare Other | Admitting: Anesthesiology

## 2013-10-01 ENCOUNTER — Observation Stay (HOSPITAL_COMMUNITY)
Admission: RE | Admit: 2013-10-01 | Discharge: 2013-10-02 | Disposition: A | Discharge status: Home / Self Care | Payer: Medicare Other | Source: Ambulatory Visit | Attending: Obstetrics and Gynecology | Admitting: Obstetrics and Gynecology

## 2013-10-01 ENCOUNTER — Encounter (HOSPITAL_COMMUNITY): Payer: Medicare Other | Admitting: Anesthesiology

## 2013-10-01 ENCOUNTER — Encounter (HOSPITAL_COMMUNITY)
Admission: RE | Disposition: A | Discharge status: Home / Self Care | Payer: Self-pay | Source: Ambulatory Visit | Attending: Obstetrics and Gynecology

## 2013-10-01 ENCOUNTER — Encounter (HOSPITAL_COMMUNITY): Payer: Self-pay | Admitting: *Deleted

## 2013-10-01 DIAGNOSIS — N816 Rectocele: Secondary | ICD-10-CM | POA: Diagnosis not present

## 2013-10-01 DIAGNOSIS — E119 Type 2 diabetes mellitus without complications: Secondary | ICD-10-CM | POA: Insufficient documentation

## 2013-10-01 DIAGNOSIS — K644 Residual hemorrhoidal skin tags: Secondary | ICD-10-CM | POA: Diagnosis not present

## 2013-10-01 DIAGNOSIS — N993 Prolapse of vaginal vault after hysterectomy: Principal | ICD-10-CM | POA: Insufficient documentation

## 2013-10-01 DIAGNOSIS — Z90710 Acquired absence of both cervix and uterus: Secondary | ICD-10-CM | POA: Insufficient documentation

## 2013-10-01 DIAGNOSIS — N8111 Cystocele, midline: Secondary | ICD-10-CM | POA: Diagnosis not present

## 2013-10-01 DIAGNOSIS — E785 Hyperlipidemia, unspecified: Secondary | ICD-10-CM | POA: Insufficient documentation

## 2013-10-01 HISTORY — PX: ANTERIOR AND POSTERIOR REPAIR: SHX5121

## 2013-10-01 LAB — GLUCOSE, CAPILLARY: Glucose-Capillary: 111 mg/dL — ABNORMAL HIGH (ref 70–99)

## 2013-10-01 SURGERY — ANTERIOR (CYSTOCELE) AND POSTERIOR REPAIR (RECTOCELE)
Anesthesia: Spinal

## 2013-10-01 MED ORDER — FENTANYL CITRATE 0.05 MG/ML IJ SOLN
INTRAMUSCULAR | Status: AC
  Filled 2013-10-01: qty 5

## 2013-10-01 MED ORDER — IBUPROFEN 600 MG PO TABS: 600.0000 mg | ORAL_TABLET | Freq: Four times a day (QID) | ORAL | Status: DC | PRN

## 2013-10-01 MED ORDER — MIDAZOLAM HCL 2 MG/2ML IJ SOLN
1.0000 mg | INTRAMUSCULAR | Status: DC | PRN
  Administered 2013-10-01: 2 mg via INTRAVENOUS

## 2013-10-01 MED ORDER — CEFAZOLIN SODIUM-DEXTROSE 2-3 GM-% IV SOLR
2.0000 g | INTRAVENOUS | Status: AC
  Administered 2013-10-01: 2 g via INTRAVENOUS

## 2013-10-01 MED ORDER — LACTATED RINGERS IV SOLN
INTRAVENOUS | Status: DC
  Administered 2013-10-01: 07:00:00 via INTRAVENOUS

## 2013-10-01 MED ORDER — LACTATED RINGERS IV SOLN
INTRAVENOUS | Status: DC | PRN
  Administered 2013-10-01: 1000 mL
  Administered 2013-10-01 (×2): via INTRAVENOUS

## 2013-10-01 MED ORDER — MIDAZOLAM HCL 2 MG/2ML IJ SOLN
INTRAMUSCULAR | Status: AC
  Filled 2013-10-01: qty 2

## 2013-10-01 MED ORDER — FENTANYL CITRATE 0.05 MG/ML IJ SOLN
INTRAMUSCULAR | Status: AC
  Filled 2013-10-01: qty 2

## 2013-10-01 MED ORDER — SODIUM CHLORIDE 0.9 % IJ SOLN
INTRAMUSCULAR | Status: AC
  Filled 2013-10-01: qty 10

## 2013-10-01 MED ORDER — LIDOCAINE HCL (PF) 1 % IJ SOLN
INTRAMUSCULAR | Status: AC
  Filled 2013-10-01: qty 5

## 2013-10-01 MED ORDER — BUPIVACAINE-EPINEPHRINE (PF) 0.5% -1:200000 IJ SOLN
INTRAMUSCULAR | Status: AC
  Filled 2013-10-01: qty 30

## 2013-10-01 MED ORDER — LIDOCAINE HCL (CARDIAC) 20 MG/ML IV SOLN
INTRAVENOUS | Status: DC | PRN
  Administered 2013-10-01: 50 mg via INTRAVENOUS

## 2013-10-01 MED ORDER — PROPOFOL INFUSION 10 MG/ML OPTIME
INTRAVENOUS | Status: DC | PRN
Start: 2013-10-01 — End: 2013-10-01
  Administered 2013-10-01: 25 ug/kg/min via INTRAVENOUS

## 2013-10-01 MED ORDER — PROPOFOL 10 MG/ML IV BOLUS
INTRAVENOUS | Status: AC
  Filled 2013-10-01: qty 20

## 2013-10-01 MED ORDER — CEFAZOLIN SODIUM-DEXTROSE 2-3 GM-% IV SOLR
INTRAVENOUS | Status: AC
  Filled 2013-10-01: qty 50

## 2013-10-01 MED ORDER — ONDANSETRON HCL 4 MG/2ML IJ SOLN
4.0000 mg | Freq: Once | INTRAMUSCULAR | Status: AC
  Administered 2013-10-01: 4 mg via INTRAVENOUS

## 2013-10-01 MED ORDER — ONDANSETRON HCL 4 MG PO TABS: 4.0000 mg | ORAL_TABLET | Freq: Four times a day (QID) | ORAL | Status: DC | PRN

## 2013-10-01 MED ORDER — KETOROLAC TROMETHAMINE 30 MG/ML IJ SOLN
30.0000 mg | Freq: Once | INTRAMUSCULAR | Status: AC
  Administered 2013-10-01: 30 mg via INTRAVENOUS
  Filled 2013-10-01: qty 1

## 2013-10-01 MED ORDER — SIMVASTATIN 20 MG PO TABS
20.0000 mg | ORAL_TABLET | Freq: Every day | ORAL | Status: DC
  Filled 2013-10-01: qty 1

## 2013-10-01 MED ORDER — FENTANYL CITRATE 0.05 MG/ML IJ SOLN
INTRAMUSCULAR | Status: DC | PRN
  Administered 2013-10-01: 50 ug via INTRAVENOUS
  Administered 2013-10-01: 30 ug via INTRAVENOUS
  Administered 2013-10-01: 25 ug via INTRAVENOUS
  Administered 2013-10-01: 20 ug via INTRATHECAL
  Administered 2013-10-01: 25 ug via INTRAVENOUS

## 2013-10-01 MED ORDER — EPHEDRINE SULFATE 50 MG/ML IJ SOLN
INTRAMUSCULAR | Status: AC
  Filled 2013-10-01: qty 1

## 2013-10-01 MED ORDER — SODIUM CHLORIDE 0.9 % IV SOLN
INTRAVENOUS | Status: DC
  Administered 2013-10-02: 01:00:00 via INTRAVENOUS

## 2013-10-01 MED ORDER — OXYCODONE-ACETAMINOPHEN 5-325 MG PO TABS: 1.0000 | ORAL_TABLET | ORAL | Status: DC | PRN

## 2013-10-01 MED ORDER — PANTOPRAZOLE SODIUM 40 MG PO TBEC: 40.0000 mg | DELAYED_RELEASE_TABLET | Freq: Every day | ORAL | Status: DC

## 2013-10-01 MED ORDER — ONDANSETRON HCL 4 MG/2ML IJ SOLN: 4.0000 mg | Freq: Once | INTRAMUSCULAR | Status: DC | PRN

## 2013-10-01 MED ORDER — EPHEDRINE SULFATE 50 MG/ML IJ SOLN
INTRAMUSCULAR | Status: DC | PRN
  Administered 2013-10-01 (×2): 10 mg via INTRAVENOUS

## 2013-10-01 MED ORDER — BUPIVACAINE-EPINEPHRINE 0.5% -1:200000 IJ SOLN
INTRAMUSCULAR | Status: DC | PRN
  Administered 2013-10-01: 28 mL

## 2013-10-01 MED ORDER — ONDANSETRON HCL 4 MG/2ML IJ SOLN
INTRAMUSCULAR | Status: AC
  Filled 2013-10-01: qty 2

## 2013-10-01 MED ORDER — STERILE WATER FOR IRRIGATION IR SOLN
Status: DC | PRN
  Administered 2013-10-01: 1000 mL

## 2013-10-01 MED ORDER — 0.9 % SODIUM CHLORIDE (POUR BTL) OPTIME
TOPICAL | Status: DC | PRN
  Administered 2013-10-01: 1000 mL

## 2013-10-01 MED ORDER — DOCUSATE SODIUM 100 MG PO CAPS
100.0000 mg | ORAL_CAPSULE | Freq: Two times a day (BID) | ORAL | Status: DC
  Administered 2013-10-01: 100 mg via ORAL
  Filled 2013-10-01: qty 1

## 2013-10-01 MED ORDER — HYDROMORPHONE HCL PF 1 MG/ML IJ SOLN: 1.0000 mg | INTRAMUSCULAR | Status: DC | PRN

## 2013-10-01 MED ORDER — MIDAZOLAM HCL 5 MG/5ML IJ SOLN
INTRAMUSCULAR | Status: DC | PRN
  Administered 2013-10-01 (×2): 1 mg via INTRAVENOUS

## 2013-10-01 MED ORDER — FENTANYL CITRATE 0.05 MG/ML IJ SOLN
25.0000 ug | INTRAMUSCULAR | Status: DC
  Administered 2013-10-01: 25 ug via INTRAVENOUS

## 2013-10-01 MED ORDER — ONDANSETRON HCL 4 MG/2ML IJ SOLN: 4.0000 mg | Freq: Four times a day (QID) | INTRAMUSCULAR | Status: DC | PRN

## 2013-10-01 MED ORDER — BUPIVACAINE HCL (PF) 0.75 % IJ SOLN
INTRAMUSCULAR | Status: DC | PRN
  Administered 2013-10-01: 12 mg via INTRATHECAL

## 2013-10-01 MED ORDER — FENTANYL CITRATE 0.05 MG/ML IJ SOLN: 25.0000 ug | INTRAMUSCULAR | Status: DC | PRN

## 2013-10-01 MED ORDER — KETOROLAC TROMETHAMINE 30 MG/ML IJ SOLN
30.0000 mg | Freq: Four times a day (QID) | INTRAMUSCULAR | Status: DC
  Administered 2013-10-01 – 2013-10-02 (×3): 30 mg via INTRAVENOUS
  Filled 2013-10-01 (×3): qty 1

## 2013-10-01 MED ORDER — KETOROLAC TROMETHAMINE 30 MG/ML IJ SOLN: 30.0000 mg | Freq: Four times a day (QID) | INTRAMUSCULAR | Status: DC

## 2013-10-01 MED ORDER — SODIUM CHLORIDE 0.9 % IR SOLN
Status: DC | PRN
  Administered 2013-10-01: 1000 mL

## 2013-10-01 SURGICAL SUPPLY — 37 items
BAG HAMPER (MISCELLANEOUS) ×2 IMPLANT
CLOTH BEACON ORANGE TIMEOUT ST (SAFETY) ×2 IMPLANT
COVER LIGHT HANDLE STERIS (MISCELLANEOUS) ×4 IMPLANT
DECANTER SPIKE VIAL GLASS SM (MISCELLANEOUS) ×2 IMPLANT
DRAPE PROXIMA HALF (DRAPES) ×2 IMPLANT
DRAPE STERI URO 9X17 APER PCH (DRAPES) ×2 IMPLANT
ELECT REM PT RETURN 9FT ADLT (ELECTROSURGICAL) ×2
ELECTRODE REM PT RTRN 9FT ADLT (ELECTROSURGICAL) ×1 IMPLANT
FORMALIN 10 PREFIL 480ML (MISCELLANEOUS) ×2 IMPLANT
GAUZE PACKING 2X5 YD STERILE (GAUZE/BANDAGES/DRESSINGS) ×1 IMPLANT
GAUZE PACKING 2X5 YD STRL (GAUZE/BANDAGES/DRESSINGS) ×2 IMPLANT
GLOVE BIOGEL PI IND STRL 7.0 (GLOVE) IMPLANT
GLOVE BIOGEL PI IND STRL 7.5 (GLOVE) IMPLANT
GLOVE BIOGEL PI IND STRL 9 (GLOVE) ×1 IMPLANT
GLOVE BIOGEL PI INDICATOR 7.0 (GLOVE) ×3
GLOVE BIOGEL PI INDICATOR 7.5 (GLOVE) ×2
GLOVE BIOGEL PI INDICATOR 9 (GLOVE) ×1
GLOVE ECLIPSE 7.0 STRL STRAW (GLOVE) ×1 IMPLANT
GLOVE ECLIPSE 9.0 STRL (GLOVE) ×2 IMPLANT
GLOVE EXAM NITRILE MD LF STRL (GLOVE) ×1 IMPLANT
GLOVE SURG SS PI 7.5 STRL IVOR (GLOVE) ×1 IMPLANT
GOWN SPEC L3 XXLG W/TWL (GOWN DISPOSABLE) ×3 IMPLANT
GOWN STRL REUS W/TWL LRG LVL3 (GOWN DISPOSABLE) ×5 IMPLANT
KIT ROOM TURNOVER AP CYSTO (KITS) ×2 IMPLANT
MANIFOLD NEPTUNE II (INSTRUMENTS) ×2 IMPLANT
NDL HYPO 25X1 1.5 SAFETY (NEEDLE) ×1 IMPLANT
NEEDLE HYPO 25X1 1.5 SAFETY (NEEDLE) ×2 IMPLANT
NS IRRIG 1000ML POUR BTL (IV SOLUTION) ×2 IMPLANT
PACK PERI GYN (CUSTOM PROCEDURE TRAY) ×2 IMPLANT
PAD ARMBOARD 7.5X6 YLW CONV (MISCELLANEOUS) ×2 IMPLANT
SET BASIN LINEN APH (SET/KITS/TRAYS/PACK) ×2 IMPLANT
SUT CHROMIC 2 0 CT 1 (SUTURE) ×2 IMPLANT
SUT VIC AB 0 CT2 8-18 (SUTURE) ×2 IMPLANT
SYRINGE CONTROL L 12CC (SYRINGE) ×2 IMPLANT
SYRINGE CONTROL LL 12CC (SYRINGE) ×1 IMPLANT
TRAY FOLEY CATH 16FR SILVER (SET/KITS/TRAYS/PACK) ×2 IMPLANT
VERSALIGHT (MISCELLANEOUS) ×1 IMPLANT

## 2013-10-01 NOTE — Anesthesia Procedure Notes (Addendum)
Procedure Name: MAC Date/Time: 10/01/2013 7:31 AM Performed by: Andree Elk, Gyan Cambre A Pre-anesthesia Checklist: Patient identified, Timeout performed, Emergency Drugs available, Suction available and Patient being monitored Oxygen Delivery Method: Simple face mask   Spinal  Patient location during procedure: OR Staffing CRNA/Resident: Jeremih Dearmas A Preanesthetic Checklist Completed: patient identified, site marked, surgical consent, pre-op evaluation, timeout performed, IV checked, risks and benefits discussed and monitors and equipment checked Spinal Block Patient position: right lateral decubitus Prep: Betadine Patient monitoring: heart rate, cardiac monitor, continuous pulse ox and blood pressure Approach: right paramedian Location: L3-4 Injection technique: single-shot Needle Needle type: Spinocan  Needle gauge: 22 G Needle length: 9 cm Assessment Sensory level: T8 Additional Notes ATTEMPTS:2 TRAY FW:[id number] TRAY EXPIRATION DATE:11/2014

## 2013-10-01 NOTE — Brief Op Note (Signed)
10/01/2013  9:21 AM  PATIENT:  Conrad [place]  68 y.o. female  PRE-OPERATIVE DIAGNOSIS:  rectocele, cystocele  POST-OPERATIVE DIAGNOSIS:  rectocele, cystocele  PROCEDURE:  Procedure(s): ANTERIOR (CYSTOCELE) AND POSTERIOR REPAIR (RECTOCELE) (N/A)  SURGEON:  Surgeon(s) and Role:    * Jonnie Kind, MD - Primary  PHYSICIAN ASSISTANT:   ASSISTANTS: Philipp Ovens CST, Ashley RN FA   ANESTHESIA:   spinal, local with Marcaine  EBL:  Total I/O In: 1100 [I.V.:1100] Out: -   BLOOD ADMINISTERED:none  DRAINS: Urinary Catheter (Foley) and Vaginal pack Betadine soaked   LOCAL MEDICATIONS USED:  MARCAINE    and Amount: 20 ml  SPECIMEN:  No Specimen  DISPOSITION OF SPECIMEN:  N/A  COUNTS:  YES  TOURNIQUET:  * No tourniquets in log *  DICTATION: .Dragon Dictation  PLAN OF CARE: Admit for overnight observation  PATIENT DISPOSITION:  PACU - hemodynamically stable.   Delay start of Pharmacological VTE agent (>24hrs) due to surgical blood loss or risk of bleeding: not applicable

## 2013-10-01 NOTE — Transfer of Care (Signed)
Immediate Anesthesia Transfer of Care Note  Patient: Cathy Mclean  Procedure(s) Performed: Procedure(s): ANTERIOR (CYSTOCELE) AND POSTERIOR REPAIR (RECTOCELE) (N/A)  Patient Location: PACU  Anesthesia Type:Spinal  Level of Consciousness: awake, alert , oriented and patient cooperative  Airway & Oxygen Therapy: Patient Spontanous Breathing and Patient connected to nasal cannula oxygen  Post-op Assessment: Report given to PACU RN and Post -op Vital signs reviewed and stable  Post vital signs: Reviewed and stable  Complications: No apparent anesthesia complications

## 2013-10-01 NOTE — Op Note (Signed)
10/01/2013  9:21 AM  PATIENT:  Cathy Mclean   68 y.o. female  PRE-OPERATIVE DIAGNOSIS:  rectocele, cystocele  POST-OPERATIVE DIAGNOSIS:  rectocele, cystocele  PROCEDURE:  Procedure(s): ANTERIOR (CYSTOCELE) AND POSTERIOR REPAIR (RECTOCELE) (N/A)  SURGEON:  Surgeon(s) and Role:    * Jonnie Kind, MD - Primary  PHYSICIAN ASSISTANT:   ASSISTANTS: Philipp Ovens CST, Ashley RN FA   ANESTHESIA:   spinal, local with Marcaine  EBL:  Total I/O In: 1100 [I.V.:1100] Out: -   BLOOD ADMINISTERED:none  DRAINS: Urinary Catheter (Foley) and Vaginal pack Betadine soaked   LOCAL MEDICATIONS USED:  MARCAINE    and Amount: 20 ml  SPECIMEN:  No Specimen  DISPOSITION OF SPECIMEN:  N/A  COUNTS:  YES  TOURNIQUET:  * No tourniquets in log *  DICTATION: .Dragon Dictation  PLAN OF CARE: Admit for overnight observation  PATIENT DISPOSITION:  PACU - hemodynamically stable.   Delay start of Pharmacological VTE agent (>24hrs) due to surgical blood loss or risk of bleeding: not applicable  Details of procedure: Patient was to your operating room spinal anesthesia introduced, anesthesia confirmed, timeout confirmed perineum prepped and draped with legs in standard lithotomy position. Ancef was administered preoperatively. A speculum was placed in the vagina. Inspection the vaginal vault support showed that the left uterosacral ligament was well attached to the vaginal apex and supplied adequate apical support, however there was a large cystocele anteriorly that was more prominent dominant on the patient's right side due to loss of uterosacral attachment to the apex. The anterior vaginal epithelium was infiltrated with 10 cc of Marcaine with epinephrine and split in the midline is in front of the vaginal cuff scar to midway the of the vagina. Foley catheter was placed to allow identification of the urethra trigone which was well away from the area of surgery. The vaginal epithelium was sharply  dissected laterally, being very careful to stay close the superficial vomit just beneath the epithelium dissecting in transverse fashion to reduce the redundant vaginal epithelium. A series of 4 interrupted horizontal mattress sutures were placed side-to-side on the underside of the vaginal epithelium pulling it in the midline and reducing the cystocele significantly. Redundant vaginal epithelium was trimmed and running 2-0 chromic used to reapproximate the vaginal epithelium edges anteriorly. The bladder support was improved significantly. Posterior repair. Allis clamps were placed at 5 and 7:00 on the posterior perineal body at the level of the hymen remnants, and the posterior vaginal epithelium infiltrated with 10 cc of Marcaine with epinephrine, split in the midline over the lower one half of the vagina and sharp dissection laterally and upward just beneath the vaginal epithelium allowed exposure of lateral support tissue. The the use of Allis clamps allowed me to identify loose tissue primarily on the patient's right side that could be grasped elevated and pulled across the midline. This appeared that the patient had had a left-sided paravaginal defect from prior obstetric injuries. A series of interrupted 2-0 Vicryl sutures were used to pull the tissue from the right side across the midline and cephalad attaching it to the left paravaginal tissues resulting in a significantly thickened and firm rectovaginal septum over the area of previous weakness. This was performed while the operator's double gloved right index finger was in the rectum to ensure the protection of the rectum.. After proper placement and tagging of the posterior sutures the operator's right hand Your was extracted from beneath the vaginal bed, out of the rectum being careful to maintain sterility of  the vagina surfaces. Gloves were changed, and the sutures tied down resulting in significantly improved posterior support the vaginal entrance  still allowed placement 2 fingers, which should be adequate introitus size and diameter to continue sexual activity. Vaginal epithelium was reapproximated using interrupted 2-0 chromic double gloved rectal exam was again checked to confirm that the anterior support was satisfactory and no suspicion of sutures noted. Sponge and needle counts were correct. Small vaginal packing with Betadine soaked gauze followed and patient was allowed to go recovery room in stable condition sponge and needle counts correct

## 2013-10-01 NOTE — Anesthesia Preprocedure Evaluation (Signed)
Anesthesia Evaluation  Patient identified by MRN, date of birth, ID band Patient awake    Reviewed: Allergy & Precautions, H&P , NPO status , Patient's Chart, lab work & pertinent test results  Airway Mallampati: II TM Distance: >3 FB Neck ROM: Full    Dental  (+) Implants, Poor Dentition, Caps, Dental Advisory Given   Pulmonary neg pulmonary ROS,  breath sounds clear to auscultation        Cardiovascular negative cardio ROS  Rhythm:Regular Rate:Normal     Neuro/Psych    GI/Hepatic negative GI ROS,   Endo/Other  Early DM II, elevated glucose.  Renal/GU      Musculoskeletal   Abdominal   Peds  Hematology   Anesthesia Other Findings   Reproductive/Obstetrics                           Anesthesia Physical Anesthesia Plan  ASA: II  Anesthesia Plan: Spinal   Post-op Pain Management:    Induction: Intravenous  Airway Management Planned: Nasal Cannula  Additional Equipment:   Intra-op Plan:   Post-operative Plan:   Informed Consent: I have reviewed the patients History and Physical, chart, labs and discussed the procedure including the risks, benefits and alternatives for the proposed anesthesia with the patient or authorized representative who has indicated his/her understanding and acceptance.     Plan Discussed with:   Anesthesia Plan Comments: (SAB marcaine with propofol sedation.)        Anesthesia Quick Evaluation

## 2013-10-01 NOTE — H&P (Signed)
Cathy Mclean is an 68 y.o. female. She is admitted at this time for Anterior and posterior repair. She is s/p vaginal hysterectomy, and has had persistent symptoms of difficulty with defecation, and evaluation has shown a rectocele defect in the posterior vaginal wall support, a couple of centimeters above the anal sphincter. She has developed rectal bleeding and hemorrhoids from the accompanying straining, and has been evaluated recently by Dr Oneida Alar for this, with no colon abnormalities found, and external hemorrhoids noted to be the source of the bleeding, with banding x 3 performed. She returns now for correction of the rectocele defect, as well as reduction of the anterior wall laxity(cystocele) that is developing .  Vaginal apex support is considered adequate at this time. Pertinent Gynecological History: Menses: s/p hysterectomy Bleeding:  Contraception: status post hysterectomy DES exposure: unknown Blood transfusions: none Sexually transmitted diseases: no past history Previous GYN Procedures: vaginal hysterectomy  Last mammogram: obtained outside [facility] Date:  Last pap: normal Date:  OB History: G, P   Menstrual History: Menarche age:  No LMP recorded. Patient has had a hysterectomy.    Past Medical History  Diagnosis Date  . Elevated cholesterol   . Vaginal vault prolapse   . Rectocele 07/30/2013  . Hemorrhoids 07/30/2013  . Prolapse of vaginal vault after hysterectomy 07/30/2013  . UI (urinary incontinence) 07/30/2013    Past Surgical History  Procedure Laterality Date  . Abdominal hysterectomy    . Fracture surgery    . Foot surgery Left   . Tonsilectomy, adenoidectomy, bilateral myringotomy and tubes    . Colonoscopy  07/01/2004    Dr. Gala Romney- normal rectum, L sided diverticula, the remainder of the colonic mucosa appeared normal.  . Colonoscopy N/A 09/22/2013    Procedure: COLONOSCOPY;  Surgeon: Danie Binder, MD;  Location: [facility];  Service: Endoscopy;   Laterality: N/A;  1:00 PM  . Hemorrhoid banding N/A 09/22/2013    Procedure: HEMORRHOID BANDING;  Surgeon: Danie Binder, MD;  Location: [facility];  Service: Endoscopy;  Laterality: N/A;    Family History  Problem Relation Age of Onset  . Dementia Mother   . Parkinson's disease Mother   . Dementia Father   . Asthma Father   . Heart disease Maternal Grandfather   . Tuberculosis Paternal Grandmother     Social History:  reports that she has never smoked. She has never used smokeless tobacco. She reports that she does not drink alcohol or use illicit drugs.  Allergies:  Allergies  Allergen Reactions  . Caffeine Other (See Comments)    Makes patient anxious and shakey.  . Clindamycin/Lincomycin   . Codeine     Prescriptions prior to admission  Medication Sig Dispense Refill  . calcium carbonate 200 MG capsule Take 250 mg by mouth 2 (two) times daily with a meal.      . Cholecalciferol (VITAMIN D PO) Take 1 tablet by mouth daily.      Marland Kitchen estradiol (ESTRACE VAGINAL) 0.1 MG/GM vaginal cream Place 1 Applicatorful vaginally at bedtime.  48 g  0  . fish oil-omega-3 fatty acids 1000 MG capsule Take 2 g by mouth daily.      . hydrocortisone-pramoxine (PROCTOFOAM HC) rectal foam Place 1 applicator rectally 2 (two) times daily.  10 g  1  . lovastatin (MEVACOR) 40 MG tablet Take 40 mg by mouth at bedtime.      . Multiple Vitamin (MULTIVITAMIN) capsule Take 1 capsule by mouth daily.      Marland Kitchen  polyethylene glycol powder (GLYCOLAX/MIRALAX) powder Take 1 Container by mouth once.      . zinc gluconate 50 MG tablet Take 50 mg by mouth daily.        ROS    Temperature 97.8 F (36.6 C), temperature source Oral, height 5' (1.524 m), weight 148 lb (67.132 kg). Physical Exam  Constitutional: She is oriented to person, place, and time. She appears well-developed and well-nourished.  HENT:  Head: Normocephalic.  Eyes: Pupils are equal, round, and reactive to light.  Neck: Neck supple.   Cardiovascular: Normal rate and regular rhythm.   Respiratory: Effort normal.  GI: Soft. Bowel sounds are normal.  Genitourinary:  Posterior wall laxity with digital rectal confirming a defect in posterior vaginal wall support midway along the posterior vagina, a couple of centimeters above the anal sphincter.  Adequate vaginal apex support, with mild cystocele.  Musculoskeletal: Normal range of motion.  Neurological: She is alert and oriented to person, place, and time.  Skin: Skin is warm and dry.  Psychiatric: She has a normal mood and affect. Her behavior is normal. Thought content normal.   CBC    Component Value Date/Time   WBC 6.6 09/18/2013 1300   RBC 4.75 09/18/2013 1300   HGB 14.2 09/18/2013 1300   HCT 42.5 09/18/2013 1300   PLT 197 09/18/2013 1300   MCV 89.5 09/18/2013 1300   MCH 29.9 09/18/2013 1300   MCHC 33.4 09/18/2013 1300   RDW 13.0 09/18/2013 1300    BMET    Component Value Date/Time   NA 140 09/18/2013 1300   K 4.7 09/18/2013 1300   CL 99 09/18/2013 1300   CO2 29 09/18/2013 1300   GLUCOSE 191* 09/18/2013 1300   BUN 19 09/18/2013 1300   CREATININE 0.73 09/18/2013 1300   CALCIUM 9.3 09/18/2013 1300   GFRNONAA 86* 09/18/2013 1300   GFRAA >90 09/18/2013 1300     Assessment/Plan:  Symptomatic rectocele, and small cystocele, for surgical correction. Status post vaginal hysterectomy / status post recent colonoscopy with hemorrhoidal banding  Plan:   Anterior and posterior repair 10/01/13  Cathy Mclean V 10/01/2013, 6:31 AM

## 2013-10-01 NOTE — Anesthesia Postprocedure Evaluation (Signed)
  Anesthesia Post-op Note  Patient: Cathy Mclean  Procedure(s) Performed: Procedure(s): ANTERIOR (CYSTOCELE) AND POSTERIOR REPAIR (RECTOCELE) (N/A)  Patient Location: PACU  Anesthesia Type:Spinal  Level of Consciousness: awake, alert , oriented and patient cooperative  Airway and Oxygen Therapy: Patient Spontanous Breathing and Patient connected to nasal cannula oxygen  Post-op Pain: none  Post-op Assessment: Post-op Vital signs reviewed, Patient's Cardiovascular Status Stable, Respiratory Function Stable, Patent Airway, No signs of Nausea or vomiting and Pain level controlled  Post-op Vital Signs: Reviewed and stable  Last Vitals:  Filed Vitals:   10/01/13 0715  BP: 132/83  Pulse:   Temp:   Resp: 15    Complications: No apparent anesthesia complications

## 2013-10-02 DIAGNOSIS — E785 Hyperlipidemia, unspecified: Secondary | ICD-10-CM | POA: Diagnosis not present

## 2013-10-02 DIAGNOSIS — E119 Type 2 diabetes mellitus without complications: Secondary | ICD-10-CM | POA: Diagnosis not present

## 2013-10-02 DIAGNOSIS — Z90710 Acquired absence of both cervix and uterus: Secondary | ICD-10-CM | POA: Diagnosis not present

## 2013-10-02 DIAGNOSIS — N993 Prolapse of vaginal vault after hysterectomy: Secondary | ICD-10-CM | POA: Diagnosis not present

## 2013-10-02 DIAGNOSIS — K644 Residual hemorrhoidal skin tags: Secondary | ICD-10-CM | POA: Diagnosis not present

## 2013-10-02 LAB — BASIC METABOLIC PANEL
Anion gap: 8 (ref 5–15)
BUN: 13 mg/dL (ref 6–23)
CALCIUM: 8.5 mg/dL (ref 8.4–10.5)
CO2: 28 mEq/L (ref 19–32)
Chloride: 107 mEq/L (ref 96–112)
Creatinine, Ser: 0.65 mg/dL (ref 0.50–1.10)
GFR, EST NON AFRICAN AMERICAN: 89 mL/min — AB (ref >90)
GLUCOSE: 97 mg/dL (ref 70–99)
Potassium: 4.1 mEq/L (ref 3.7–5.3)
SODIUM: 143 meq/L (ref 137–147)

## 2013-10-02 LAB — CBC
HCT: 39.6 % (ref 36.0–46.0)
Hemoglobin: 13.2 g/dL (ref 12.0–15.0)
MCH: 30.1 pg (ref 26.0–34.0)
MCHC: 33.3 g/dL (ref 30.0–36.0)
MCV: 90.2 fL (ref 78.0–100.0)
PLATELETS: 187 10*3/uL (ref 150–400)
RBC: 4.39 MIL/uL (ref 3.87–5.11)
RDW: 13.1 % (ref 11.5–15.5)
WBC: 8.5 10*3/uL (ref 4.0–10.5)

## 2013-10-02 MED ORDER — POLYETHYLENE GLYCOL 3350 17 GM/SCOOP PO POWD: 17.0000 g | Freq: Every day | ORAL | Status: AC

## 2013-10-02 MED ORDER — OXYCODONE-ACETAMINOPHEN 5-325 MG PO TABS: 1.0000 | ORAL_TABLET | ORAL | Status: DC | PRN

## 2013-10-02 NOTE — Progress Notes (Signed)
Pt foley removed at 0600 and pt has been up to void.  Pt missed hat and was unable to get an accurate measurement.  Pt states "it was a lot".

## 2013-10-02 NOTE — Discharge Instructions (Signed)
Rectocele/Enterocele, Care After A woman's birth canal (vagina) can become weak or stretched. This can be caused by childbirth, heavy lifting, lasting (chronic) constipation, aging, or pelvic surgery. When the vagina is weak and stretched, parts of the intestine can bulge into the vagina by pushing against the vaginal walls. A rectocele is when the very end of the large intestine (rectum) causes the bulge. An enterocele is when the small intestine causes the bulge. Surgery to fix this problem is usually done through the vagina. If you just had this surgery, you were probably given a drug to make you sleep (general anesthetic) or a drug that numbs you from the waist down (spinal/epidural). Here is what happened:  The small intestine or rectum was pushed back to its normal place.  The vaginal wall was made stronger. Sometimes this is done with stitches or a mesh-like material. HOME CARE INSTRUCTIONS  Some women go home the same day as their surgery. Others stay in the hospital for a few days. This depends on the size and type of repair.  Pain and Medications  Some pain is normal after this surgery. Only take pain medicine your surgeon prescribed. Follow the directions carefully.  Do not take aspirin. It can cause bleeding.  Do not drink alcohol while taking pain medication.  You may be given a medicine (antibiotic) that kills germs. Follow the directions carefully.  Take warm sitz baths 2 times a day to control discomfort and reduce any swelling. Take sitz baths with your caregiver's permission. Diet  Go back to your normal eating as directed by your caregiver.  Drink a lot of fluids. Drink at least 6 glasses of water every day. Activity  Move around and walk as much as possible. This can keep blood clots from forming in your legs.  Do not climb stairs until your caregiver says it is okay.  Do not lift objects 5 pounds (2.3 kg) or heavier. Do not bend or strain for 6 to 8 weeks.  Do  not drive until after you stop taking pain medicine and your caregiver says it is okay.  Your return to work will depend on the type of work you do. Ask your caregiver what is best for you.  Ask your caregiver when you can resume sexual activity. Most women can start having sex in about 6 weeks after their surgery.  Get plenty of rest during the day and sleep at night.  Have someone help you with your household chores and activities for 3 to 4 weeks. Other Precautions  You may have some discharge from the vagina for a few weeks after the surgery. It may have small amounts of blood in it. This is normal. If you have questions, ask your caregiver.  Do not use tampons or douche.  You should be able to take a shower a day after your surgery. Do not take a tub bath for at least a week.  Take it easy for awhile. You should feel much better in 2 to 3 weeks. It may take up to 6 weeks to feel completely normal.  Keep all follow-up appointments.  Take your temperature twice a day and write it down.  Make sure your family understands everything about your surgery and recovery. SEEK MEDICAL CARE IF:   You have any questions about your medication, or you need stronger pain medication.  Pain continues, even after taking pain medication.  You become constipated.  You have an oral temperature above 102 F (38.9 C).    You develop swelling and redness in the surgery area.  You become dizzy or lightheaded.  You feel sick to your stomach (nauseous), throw up (vomit), or have diarrhea.  You develop a rash.  You have a reaction to your medications. SEEK IMMEDIATE MEDICAL CARE IF:   Pain gets worse.  You have new bleeding from your vagina.  Discharge from the vagina becomes heavy, or it has a bad smell.  You have an oral temperature above 102 F (38.9 C), not controlled by medicine.  You develop belly (abdominal) pain.  You develop chest pain.  You develop shortness of  breath.  You pass out (faint).  You develop pain, swelling, or redness in the leg.  You have pain or burning with urination.  You have bloody urine or cannot urinate. MAKE SURE YOU:   Understand these instructions.  Will watch your condition.  Will get help right away if you are not doing well or get worse. Document Released: 05/17/2009 Document Revised: 12/11/2012 Document Reviewed: 05/17/2009 Exit[employer] Patient Information 2015 [employer]. This information is not intended to replace advice given to you by your health care provider. Make sure you discuss any questions you have with your health care provider.  

## 2013-10-02 NOTE — Discharge Summary (Signed)
Physician Discharge Summary  Patient ID: Cathy Mclean MRN: [id number] DOB/AGE: 68-Jun-1947 68 y.o.  Admit date: 10/01/2013 Discharge date: 10/02/2013  Admission Diagnoses: cystocele, rectocele  Discharge Diagnoses: cystocele, rectocele  Active Problems:   * No active hospital problems. *   Discharged Condition: good  Hospital Course: The patient was admitted for anterior and posterior repair as described in the admitting history. The surgical procedure itself was uncomplicated and Foley catheter with vaginal packing was placed and utilized overnight. Following morning she did void spontaneously, vaginal packing was removed patient sent home on MiraLAX with followup in one week and 4 weeks at [facility] CBC Latest Ref Rng 10/02/2013 09/18/2013  WBC 4.0 - 10.5 K/uL 8.5 6.6  Hemoglobin 12.0 - 15.0 g/dL 13.2 14.2  Hematocrit 36.0 - 46.0 % 39.6 42.5  Platelets 150 - 400 K/uL 187 197     Consults: None  Significant Diagnostic Studies:   Treatments: surgery: Anterior and posterior repair  Discharge Exam: Blood pressure 124/68, pulse 79, temperature 98.1 F (36.7 C), temperature source Oral, resp. rate 16, height 5' (1.524 m), weight 67.132 kg (148 lb), SpO2 97.00%. General appearance: alert and no distress GI: soft, non-tender; bowel sounds normal; no masses,  no organomegaly Pelvic: external genitalia normal, no adnexal masses or tenderness, rectovaginal septum normal and Light vaginal bleeding, old blood only  Disposition: 01-Home or Self Care     Medication List    ASK your doctor about these medications       calcium carbonate 200 MG capsule  Take 250 mg by mouth 2 (two) times daily with a meal.     estradiol 0.1 MG/GM vaginal cream  Commonly known as:  ESTRACE VAGINAL  Place 1 Applicatorful vaginally at bedtime.     fish oil-omega-3 fatty acids 1000 MG capsule  Take 2 g by mouth daily.     hydrocortisone-pramoxine rectal foam  Commonly known as:   PROCTOFOAM HC  Place 1 applicator rectally 2 (two) times daily.     lovastatin 40 MG tablet  Commonly known as:  MEVACOR  Take 40 mg by mouth at bedtime.     multivitamin capsule  Take 1 capsule by mouth daily.     polyethylene glycol powder powder  Commonly known as:  GLYCOLAX/MIRALAX  Take 1 Container by mouth once.     VITAMIN D PO  Take 1 tablet by mouth daily.     zinc gluconate 50 MG tablet  Take 50 mg by mouth daily.           Follow-up Information   Follow up with [facility] In 1 week. (For wound re-check, If symptoms worsen)    Contact information:   [place] [phone] [phone]      Signed: Jonnie Kind 10/02/2013, 10:42 AM

## 2013-10-03 ENCOUNTER — Encounter (HOSPITAL_COMMUNITY): Payer: Self-pay | Admitting: Obstetrics and Gynecology

## 2013-10-07 ENCOUNTER — Ambulatory Visit (INDEPENDENT_AMBULATORY_CARE_PROVIDER_SITE_OTHER): Payer: Self-pay | Admitting: Obstetrics & Gynecology

## 2013-10-07 ENCOUNTER — Encounter: Payer: Self-pay | Admitting: Obstetrics & Gynecology

## 2013-10-07 VITALS — BP 150/90 | Wt 145.0 lb

## 2013-10-07 DIAGNOSIS — N993 Prolapse of vaginal vault after hysterectomy: Secondary | ICD-10-CM

## 2013-10-07 NOTE — Progress Notes (Signed)
Patient ID: Cathy Mclean, female   DOB: 12/04/1945, 68 y.o.   MRN: [id number] 6 days post op from anterior colporrhaphy No complaints Voiding without difficulty Some minimal bleeding  Exam Healing well No blood seen  Follow up with Dr Glo Herring in 5 weeks

## 2013-10-17 DIAGNOSIS — Z79899 Other long term (current) drug therapy: Secondary | ICD-10-CM | POA: Diagnosis not present

## 2013-10-17 DIAGNOSIS — E785 Hyperlipidemia, unspecified: Secondary | ICD-10-CM | POA: Diagnosis not present

## 2013-10-24 DIAGNOSIS — Z23 Encounter for immunization: Secondary | ICD-10-CM | POA: Diagnosis not present

## 2013-10-24 DIAGNOSIS — E785 Hyperlipidemia, unspecified: Secondary | ICD-10-CM | POA: Diagnosis not present

## 2013-11-12 ENCOUNTER — Encounter: Payer: Medicare Other | Admitting: Obstetrics and Gynecology

## 2013-11-13 ENCOUNTER — Encounter: Payer: Self-pay | Admitting: Gastroenterology

## 2013-11-19 ENCOUNTER — Encounter: Payer: Self-pay | Admitting: Obstetrics and Gynecology

## 2013-11-19 ENCOUNTER — Ambulatory Visit (INDEPENDENT_AMBULATORY_CARE_PROVIDER_SITE_OTHER): Payer: Medicare Other | Admitting: Obstetrics and Gynecology

## 2013-11-19 VITALS — BP 120/70 | Ht 60.0 in | Wt 147.0 lb

## 2013-11-19 DIAGNOSIS — Z9889 Other specified postprocedural states: Secondary | ICD-10-CM

## 2013-11-19 NOTE — Progress Notes (Signed)
Patient ID: Cathy Mclean, female   DOB: 1945-12-14, 68 y.o.   MRN: [id number] Subjective:     Cathy Mclean is a 68 y.o. female who presents to the clinic 7 weeks status post anterior colporrhaphy and posterior colporrhaphy for rectocele and cystocele repair . Pt here today for post op visit. Pt states that everything is going well. Pt denies any problems or concerns at this time.  Diet:       regular without difficulty. Bowel function is: constipation but relief with Miralax use  Pain:     The patient is not having any pain.  The following portions of the patient's history were reviewed and updated as appropriate: allergies, current medications, past family history, past medical history, past social history, past surgical history and problem list.  Review of Systems Pertinent items are noted in HPI.    Objective:    BP 120/70  Ht 5' (1.524 m)  Wt 147 lb (66.679 kg)  BMI 28.71 kg/m2 General:  alert, cooperative and appears stated age  Abdomen: soft, bowel sounds active, non-tender  Incision:   healing well, no drainage, no erythema, no hernia, no seroma, no swelling, no dehiscence, incision well approximated       Pelvic: normal external genitalia, vulva, vagina, cervix absent, uterus absent and adnexa. Some scarring present; anterior and posterior support is good.      Assessment:    Doing well postoperatively. Operative findings again reviewed. Pathology report discussed.    Plan:    1. Change ASA from 325mg  to 81mg , continue any other medications you are currently taking.  2. Continue using Miralax and may try prune juice  3. Wound care discussed. 4. Activity restrictions: none 5. Anticipated return to work: now. 6. Follow up: PRN

## 2013-12-01 DIAGNOSIS — L570 Actinic keratosis: Secondary | ICD-10-CM | POA: Diagnosis not present

## 2013-12-01 DIAGNOSIS — L820 Inflamed seborrheic keratosis: Secondary | ICD-10-CM | POA: Diagnosis not present

## 2013-12-01 DIAGNOSIS — L821 Other seborrheic keratosis: Secondary | ICD-10-CM | POA: Diagnosis not present

## 2013-12-08 DIAGNOSIS — I87323 Chronic venous hypertension (idiopathic) with inflammation of bilateral lower extremity: Secondary | ICD-10-CM | POA: Diagnosis not present

## 2013-12-16 DIAGNOSIS — I83893 Varicose veins of bilateral lower extremities with other complications: Secondary | ICD-10-CM | POA: Diagnosis not present

## 2014-01-05 ENCOUNTER — Encounter: Payer: Self-pay | Admitting: Obstetrics and Gynecology

## 2014-02-06 DIAGNOSIS — Z1231 Encounter for screening mammogram for malignant neoplasm of breast: Secondary | ICD-10-CM | POA: Diagnosis not present

## 2014-03-02 DIAGNOSIS — Z85828 Personal history of other malignant neoplasm of skin: Secondary | ICD-10-CM | POA: Diagnosis not present

## 2014-03-02 DIAGNOSIS — L821 Other seborrheic keratosis: Secondary | ICD-10-CM | POA: Diagnosis not present

## 2014-03-02 DIAGNOSIS — L570 Actinic keratosis: Secondary | ICD-10-CM | POA: Diagnosis not present

## 2014-03-02 DIAGNOSIS — L719 Rosacea, unspecified: Secondary | ICD-10-CM | POA: Diagnosis not present

## 2014-03-25 DIAGNOSIS — I83893 Varicose veins of bilateral lower extremities with other complications: Secondary | ICD-10-CM | POA: Diagnosis not present

## 2014-03-25 DIAGNOSIS — I83813 Varicose veins of bilateral lower extremities with pain: Secondary | ICD-10-CM | POA: Diagnosis not present

## 2014-04-03 DIAGNOSIS — I83812 Varicose veins of left lower extremities with pain: Secondary | ICD-10-CM | POA: Diagnosis not present

## 2014-04-06 DIAGNOSIS — I8312 Varicose veins of left lower extremity with inflammation: Secondary | ICD-10-CM | POA: Diagnosis not present

## 2014-04-17 DIAGNOSIS — I83812 Varicose veins of left lower extremities with pain: Secondary | ICD-10-CM | POA: Diagnosis not present

## 2014-04-17 DIAGNOSIS — I8312 Varicose veins of left lower extremity with inflammation: Secondary | ICD-10-CM | POA: Diagnosis not present

## 2014-05-08 DIAGNOSIS — M7981 Nontraumatic hematoma of soft tissue: Secondary | ICD-10-CM | POA: Diagnosis not present

## 2014-05-08 DIAGNOSIS — I8312 Varicose veins of left lower extremity with inflammation: Secondary | ICD-10-CM | POA: Diagnosis not present

## 2014-05-08 DIAGNOSIS — I83812 Varicose veins of left lower extremities with pain: Secondary | ICD-10-CM | POA: Diagnosis not present

## 2014-06-12 DIAGNOSIS — I8311 Varicose veins of right lower extremity with inflammation: Secondary | ICD-10-CM | POA: Diagnosis not present

## 2014-06-15 DIAGNOSIS — I8311 Varicose veins of right lower extremity with inflammation: Secondary | ICD-10-CM | POA: Diagnosis not present

## 2014-06-25 DIAGNOSIS — I8312 Varicose veins of left lower extremity with inflammation: Secondary | ICD-10-CM | POA: Diagnosis not present

## 2014-06-25 DIAGNOSIS — M7981 Nontraumatic hematoma of soft tissue: Secondary | ICD-10-CM | POA: Diagnosis not present

## 2014-06-25 DIAGNOSIS — I83812 Varicose veins of left lower extremities with pain: Secondary | ICD-10-CM | POA: Diagnosis not present

## 2014-07-15 DIAGNOSIS — I8311 Varicose veins of right lower extremity with inflammation: Secondary | ICD-10-CM | POA: Diagnosis not present

## 2014-07-15 DIAGNOSIS — I83811 Varicose veins of right lower extremities with pain: Secondary | ICD-10-CM | POA: Diagnosis not present

## 2014-08-04 DIAGNOSIS — I87321 Chronic venous hypertension (idiopathic) with inflammation of right lower extremity: Secondary | ICD-10-CM | POA: Diagnosis not present

## 2014-08-04 DIAGNOSIS — M7981 Nontraumatic hematoma of soft tissue: Secondary | ICD-10-CM | POA: Diagnosis not present

## 2014-08-04 DIAGNOSIS — I8311 Varicose veins of right lower extremity with inflammation: Secondary | ICD-10-CM | POA: Diagnosis not present

## 2014-08-05 DIAGNOSIS — L570 Actinic keratosis: Secondary | ICD-10-CM | POA: Diagnosis not present

## 2014-08-28 DIAGNOSIS — I8311 Varicose veins of right lower extremity with inflammation: Secondary | ICD-10-CM | POA: Diagnosis not present

## 2014-08-28 DIAGNOSIS — I83811 Varicose veins of right lower extremities with pain: Secondary | ICD-10-CM | POA: Diagnosis not present

## 2014-08-28 DIAGNOSIS — M7981 Nontraumatic hematoma of soft tissue: Secondary | ICD-10-CM | POA: Diagnosis not present

## 2014-08-31 ENCOUNTER — Other Ambulatory Visit: Payer: Self-pay

## 2014-09-11 DIAGNOSIS — H5203 Hypermetropia, bilateral: Secondary | ICD-10-CM | POA: Diagnosis not present

## 2014-09-11 DIAGNOSIS — D3132 Benign neoplasm of left choroid: Secondary | ICD-10-CM | POA: Diagnosis not present

## 2014-09-11 DIAGNOSIS — H52223 Regular astigmatism, bilateral: Secondary | ICD-10-CM | POA: Diagnosis not present

## 2014-09-11 DIAGNOSIS — H524 Presbyopia: Secondary | ICD-10-CM | POA: Diagnosis not present

## 2014-09-11 DIAGNOSIS — H2513 Age-related nuclear cataract, bilateral: Secondary | ICD-10-CM | POA: Diagnosis not present

## 2014-09-11 DIAGNOSIS — H35363 Drusen (degenerative) of macula, bilateral: Secondary | ICD-10-CM | POA: Diagnosis not present

## 2014-10-01 DIAGNOSIS — I83811 Varicose veins of right lower extremities with pain: Secondary | ICD-10-CM | POA: Diagnosis not present

## 2014-10-08 ENCOUNTER — Encounter: Payer: Medicare Other | Admitting: Obstetrics and Gynecology

## 2014-10-19 DIAGNOSIS — Z79899 Other long term (current) drug therapy: Secondary | ICD-10-CM | POA: Diagnosis not present

## 2014-10-19 DIAGNOSIS — E785 Hyperlipidemia, unspecified: Secondary | ICD-10-CM | POA: Diagnosis not present

## 2014-10-26 DIAGNOSIS — Z6829 Body mass index (BMI) 29.0-29.9, adult: Secondary | ICD-10-CM | POA: Diagnosis not present

## 2014-10-26 DIAGNOSIS — E785 Hyperlipidemia, unspecified: Secondary | ICD-10-CM | POA: Diagnosis not present

## 2014-10-26 DIAGNOSIS — M722 Plantar fascial fibromatosis: Secondary | ICD-10-CM | POA: Diagnosis not present

## 2014-11-24 DIAGNOSIS — Z885 Allergy status to narcotic agent status: Secondary | ICD-10-CM | POA: Diagnosis not present

## 2014-11-24 DIAGNOSIS — Z881 Allergy status to other antibiotic agents status: Secondary | ICD-10-CM | POA: Diagnosis not present

## 2014-11-24 DIAGNOSIS — Q667 Congenital pes cavus: Secondary | ICD-10-CM | POA: Diagnosis not present

## 2014-11-24 DIAGNOSIS — M722 Plantar fascial fibromatosis: Secondary | ICD-10-CM | POA: Diagnosis not present

## 2015-02-12 DIAGNOSIS — Z1231 Encounter for screening mammogram for malignant neoplasm of breast: Secondary | ICD-10-CM | POA: Diagnosis not present

## 2015-03-18 DIAGNOSIS — L821 Other seborrheic keratosis: Secondary | ICD-10-CM | POA: Diagnosis not present

## 2015-03-18 DIAGNOSIS — L304 Erythema intertrigo: Secondary | ICD-10-CM | POA: Diagnosis not present

## 2015-03-18 DIAGNOSIS — Z85828 Personal history of other malignant neoplasm of skin: Secondary | ICD-10-CM | POA: Diagnosis not present

## 2015-03-18 DIAGNOSIS — L570 Actinic keratosis: Secondary | ICD-10-CM | POA: Diagnosis not present

## 2015-03-18 DIAGNOSIS — L820 Inflamed seborrheic keratosis: Secondary | ICD-10-CM | POA: Diagnosis not present

## 2015-03-18 DIAGNOSIS — L719 Rosacea, unspecified: Secondary | ICD-10-CM | POA: Diagnosis not present

## 2015-03-18 DIAGNOSIS — Z23 Encounter for immunization: Secondary | ICD-10-CM | POA: Diagnosis not present

## 2015-04-16 DIAGNOSIS — N390 Urinary tract infection, site not specified: Secondary | ICD-10-CM | POA: Diagnosis not present

## 2015-04-16 DIAGNOSIS — Z6829 Body mass index (BMI) 29.0-29.9, adult: Secondary | ICD-10-CM | POA: Diagnosis not present

## 2015-06-16 DIAGNOSIS — L570 Actinic keratosis: Secondary | ICD-10-CM | POA: Diagnosis not present

## 2015-06-16 DIAGNOSIS — C4441 Basal cell carcinoma of skin of scalp and neck: Secondary | ICD-10-CM | POA: Diagnosis not present

## 2015-06-16 DIAGNOSIS — D485 Neoplasm of uncertain behavior of skin: Secondary | ICD-10-CM | POA: Diagnosis not present

## 2015-08-03 DIAGNOSIS — C4441 Basal cell carcinoma of skin of scalp and neck: Secondary | ICD-10-CM | POA: Diagnosis not present

## 2015-09-24 DIAGNOSIS — H2513 Age-related nuclear cataract, bilateral: Secondary | ICD-10-CM | POA: Diagnosis not present

## 2015-09-24 DIAGNOSIS — H524 Presbyopia: Secondary | ICD-10-CM | POA: Diagnosis not present

## 2015-09-24 DIAGNOSIS — H52223 Regular astigmatism, bilateral: Secondary | ICD-10-CM | POA: Diagnosis not present

## 2015-09-24 DIAGNOSIS — D3132 Benign neoplasm of left choroid: Secondary | ICD-10-CM | POA: Diagnosis not present

## 2015-09-24 DIAGNOSIS — H35363 Drusen (degenerative) of macula, bilateral: Secondary | ICD-10-CM | POA: Diagnosis not present

## 2015-10-29 DIAGNOSIS — E785 Hyperlipidemia, unspecified: Secondary | ICD-10-CM | POA: Diagnosis not present

## 2015-10-29 DIAGNOSIS — Z79899 Other long term (current) drug therapy: Secondary | ICD-10-CM | POA: Diagnosis not present

## 2015-10-29 DIAGNOSIS — M1990 Unspecified osteoarthritis, unspecified site: Secondary | ICD-10-CM | POA: Diagnosis not present

## 2015-11-12 DIAGNOSIS — Z23 Encounter for immunization: Secondary | ICD-10-CM | POA: Diagnosis not present

## 2015-11-12 DIAGNOSIS — E785 Hyperlipidemia, unspecified: Secondary | ICD-10-CM | POA: Diagnosis not present

## 2015-11-12 DIAGNOSIS — M722 Plantar fascial fibromatosis: Secondary | ICD-10-CM | POA: Diagnosis not present

## 2015-11-25 DIAGNOSIS — R300 Dysuria: Secondary | ICD-10-CM | POA: Diagnosis not present

## 2015-11-25 DIAGNOSIS — N390 Urinary tract infection, site not specified: Secondary | ICD-10-CM | POA: Diagnosis not present

## 2016-01-07 DIAGNOSIS — M722 Plantar fascial fibromatosis: Secondary | ICD-10-CM | POA: Diagnosis not present

## 2016-01-19 DIAGNOSIS — G8929 Other chronic pain: Secondary | ICD-10-CM | POA: Diagnosis not present

## 2016-01-19 DIAGNOSIS — M79672 Pain in left foot: Secondary | ICD-10-CM | POA: Diagnosis not present

## 2016-01-19 DIAGNOSIS — M722 Plantar fascial fibromatosis: Secondary | ICD-10-CM | POA: Diagnosis not present

## 2016-02-02 DIAGNOSIS — M722 Plantar fascial fibromatosis: Secondary | ICD-10-CM | POA: Diagnosis not present

## 2016-02-16 DIAGNOSIS — M722 Plantar fascial fibromatosis: Secondary | ICD-10-CM | POA: Diagnosis not present

## 2016-02-16 DIAGNOSIS — M79672 Pain in left foot: Secondary | ICD-10-CM | POA: Diagnosis not present

## 2016-02-16 DIAGNOSIS — G8929 Other chronic pain: Secondary | ICD-10-CM | POA: Diagnosis not present

## 2016-02-18 DIAGNOSIS — Z1231 Encounter for screening mammogram for malignant neoplasm of breast: Secondary | ICD-10-CM | POA: Diagnosis not present

## 2016-03-08 DIAGNOSIS — S8001XA Contusion of right knee, initial encounter: Secondary | ICD-10-CM | POA: Diagnosis not present

## 2016-03-08 DIAGNOSIS — M722 Plantar fascial fibromatosis: Secondary | ICD-10-CM | POA: Diagnosis not present

## 2016-03-30 DIAGNOSIS — Z85828 Personal history of other malignant neoplasm of skin: Secondary | ICD-10-CM | POA: Diagnosis not present

## 2016-03-30 DIAGNOSIS — Z23 Encounter for immunization: Secondary | ICD-10-CM | POA: Diagnosis not present

## 2016-03-30 DIAGNOSIS — L719 Rosacea, unspecified: Secondary | ICD-10-CM | POA: Diagnosis not present

## 2016-03-30 DIAGNOSIS — M722 Plantar fascial fibromatosis: Secondary | ICD-10-CM | POA: Diagnosis not present

## 2016-03-30 DIAGNOSIS — L821 Other seborrheic keratosis: Secondary | ICD-10-CM | POA: Diagnosis not present

## 2016-03-30 DIAGNOSIS — L820 Inflamed seborrheic keratosis: Secondary | ICD-10-CM | POA: Diagnosis not present

## 2016-04-12 DIAGNOSIS — M722 Plantar fascial fibromatosis: Secondary | ICD-10-CM | POA: Diagnosis not present

## 2016-10-19 DIAGNOSIS — H2513 Age-related nuclear cataract, bilateral: Secondary | ICD-10-CM | POA: Diagnosis not present

## 2016-10-19 DIAGNOSIS — H35363 Drusen (degenerative) of macula, bilateral: Secondary | ICD-10-CM | POA: Diagnosis not present

## 2016-10-19 DIAGNOSIS — H52223 Regular astigmatism, bilateral: Secondary | ICD-10-CM | POA: Diagnosis not present

## 2016-10-19 DIAGNOSIS — H524 Presbyopia: Secondary | ICD-10-CM | POA: Diagnosis not present

## 2016-10-19 DIAGNOSIS — H5203 Hypermetropia, bilateral: Secondary | ICD-10-CM | POA: Diagnosis not present

## 2016-10-19 DIAGNOSIS — D3132 Benign neoplasm of left choroid: Secondary | ICD-10-CM | POA: Diagnosis not present

## 2017-02-23 DIAGNOSIS — Z1231 Encounter for screening mammogram for malignant neoplasm of breast: Secondary | ICD-10-CM | POA: Diagnosis not present

## 2017-04-02 DIAGNOSIS — C44319 Basal cell carcinoma of skin of other parts of face: Secondary | ICD-10-CM | POA: Diagnosis not present

## 2017-04-02 DIAGNOSIS — L570 Actinic keratosis: Secondary | ICD-10-CM | POA: Diagnosis not present

## 2017-04-02 DIAGNOSIS — L814 Other melanin hyperpigmentation: Secondary | ICD-10-CM | POA: Diagnosis not present

## 2017-04-02 DIAGNOSIS — Z85828 Personal history of other malignant neoplasm of skin: Secondary | ICD-10-CM | POA: Diagnosis not present

## 2017-04-02 DIAGNOSIS — D225 Melanocytic nevi of trunk: Secondary | ICD-10-CM | POA: Diagnosis not present

## 2017-04-02 DIAGNOSIS — L821 Other seborrheic keratosis: Secondary | ICD-10-CM | POA: Diagnosis not present

## 2017-04-02 DIAGNOSIS — D485 Neoplasm of uncertain behavior of skin: Secondary | ICD-10-CM | POA: Diagnosis not present

## 2017-04-02 DIAGNOSIS — Z23 Encounter for immunization: Secondary | ICD-10-CM | POA: Diagnosis not present

## 2017-04-02 DIAGNOSIS — D1801 Hemangioma of skin and subcutaneous tissue: Secondary | ICD-10-CM | POA: Diagnosis not present

## 2017-04-02 DIAGNOSIS — L309 Dermatitis, unspecified: Secondary | ICD-10-CM | POA: Diagnosis not present

## 2017-04-19 DIAGNOSIS — H35363 Drusen (degenerative) of macula, bilateral: Secondary | ICD-10-CM | POA: Diagnosis not present

## 2017-04-19 DIAGNOSIS — H524 Presbyopia: Secondary | ICD-10-CM | POA: Diagnosis not present

## 2017-04-19 DIAGNOSIS — H2513 Age-related nuclear cataract, bilateral: Secondary | ICD-10-CM | POA: Diagnosis not present

## 2017-04-19 DIAGNOSIS — D3132 Benign neoplasm of left choroid: Secondary | ICD-10-CM | POA: Diagnosis not present

## 2017-04-25 DIAGNOSIS — M859 Disorder of bone density and structure, unspecified: Secondary | ICD-10-CM | POA: Diagnosis not present

## 2017-04-25 DIAGNOSIS — E785 Hyperlipidemia, unspecified: Secondary | ICD-10-CM | POA: Diagnosis not present

## 2017-04-25 DIAGNOSIS — Z79899 Other long term (current) drug therapy: Secondary | ICD-10-CM | POA: Diagnosis not present

## 2017-05-02 ENCOUNTER — Other Ambulatory Visit (HOSPITAL_COMMUNITY): Payer: Self-pay | Admitting: Internal Medicine

## 2017-05-02 DIAGNOSIS — Z780 Asymptomatic menopausal state: Secondary | ICD-10-CM

## 2017-05-02 DIAGNOSIS — E785 Hyperlipidemia, unspecified: Secondary | ICD-10-CM | POA: Diagnosis not present

## 2017-05-02 DIAGNOSIS — Z6829 Body mass index (BMI) 29.0-29.9, adult: Secondary | ICD-10-CM | POA: Diagnosis not present

## 2017-05-02 DIAGNOSIS — M8580 Other specified disorders of bone density and structure, unspecified site: Secondary | ICD-10-CM | POA: Diagnosis not present

## 2017-05-07 ENCOUNTER — Ambulatory Visit (HOSPITAL_COMMUNITY)
Admission: RE | Admit: 2017-05-07 | Discharge: 2017-05-07 | Disposition: A | Discharge status: Home / Self Care | Payer: Medicare Other | Source: Ambulatory Visit | Attending: Internal Medicine | Admitting: Internal Medicine

## 2017-05-07 DIAGNOSIS — Z780 Asymptomatic menopausal state: Secondary | ICD-10-CM

## 2017-05-07 DIAGNOSIS — M8589 Other specified disorders of bone density and structure, multiple sites: Secondary | ICD-10-CM | POA: Diagnosis not present

## 2017-05-07 DIAGNOSIS — M8580 Other specified disorders of bone density and structure, unspecified site: Secondary | ICD-10-CM | POA: Diagnosis present

## 2017-05-07 DIAGNOSIS — M85852 Other specified disorders of bone density and structure, left thigh: Secondary | ICD-10-CM | POA: Insufficient documentation

## 2017-05-15 DIAGNOSIS — C44319 Basal cell carcinoma of skin of other parts of face: Secondary | ICD-10-CM | POA: Diagnosis not present

## 2017-05-15 DIAGNOSIS — L905 Scar conditions and fibrosis of skin: Secondary | ICD-10-CM | POA: Diagnosis not present

## 2017-06-20 DIAGNOSIS — L820 Inflamed seborrheic keratosis: Secondary | ICD-10-CM | POA: Diagnosis not present

## 2017-10-31 DIAGNOSIS — H524 Presbyopia: Secondary | ICD-10-CM | POA: Diagnosis not present

## 2017-10-31 DIAGNOSIS — H35363 Drusen (degenerative) of macula, bilateral: Secondary | ICD-10-CM | POA: Diagnosis not present

## 2017-10-31 DIAGNOSIS — D3132 Benign neoplasm of left choroid: Secondary | ICD-10-CM | POA: Diagnosis not present

## 2017-10-31 DIAGNOSIS — H5203 Hypermetropia, bilateral: Secondary | ICD-10-CM | POA: Diagnosis not present

## 2017-10-31 DIAGNOSIS — H2513 Age-related nuclear cataract, bilateral: Secondary | ICD-10-CM | POA: Diagnosis not present

## 2017-10-31 DIAGNOSIS — H52223 Regular astigmatism, bilateral: Secondary | ICD-10-CM | POA: Diagnosis not present

## 2017-11-28 DIAGNOSIS — M79642 Pain in left hand: Secondary | ICD-10-CM | POA: Diagnosis not present

## 2017-11-28 DIAGNOSIS — M79645 Pain in left finger(s): Secondary | ICD-10-CM | POA: Diagnosis not present

## 2017-11-28 DIAGNOSIS — R2232 Localized swelling, mass and lump, left upper limb: Secondary | ICD-10-CM | POA: Diagnosis not present

## 2017-12-04 DIAGNOSIS — Z23 Encounter for immunization: Secondary | ICD-10-CM | POA: Diagnosis not present

## 2017-12-06 DIAGNOSIS — N393 Stress incontinence (female) (male): Secondary | ICD-10-CM | POA: Diagnosis not present

## 2017-12-07 DIAGNOSIS — R2232 Localized swelling, mass and lump, left upper limb: Secondary | ICD-10-CM | POA: Diagnosis not present

## 2017-12-13 DIAGNOSIS — R2232 Localized swelling, mass and lump, left upper limb: Secondary | ICD-10-CM | POA: Diagnosis not present

## 2017-12-27 DIAGNOSIS — N8111 Cystocele, midline: Secondary | ICD-10-CM | POA: Diagnosis not present

## 2018-01-09 DIAGNOSIS — R2232 Localized swelling, mass and lump, left upper limb: Secondary | ICD-10-CM | POA: Diagnosis not present

## 2018-01-14 DIAGNOSIS — N809 Endometriosis, unspecified: Secondary | ICD-10-CM | POA: Diagnosis not present

## 2018-01-14 DIAGNOSIS — Z886 Allergy status to analgesic agent status: Secondary | ICD-10-CM | POA: Diagnosis not present

## 2018-01-14 DIAGNOSIS — Z825 Family history of asthma and other chronic lower respiratory diseases: Secondary | ICD-10-CM | POA: Diagnosis not present

## 2018-01-14 DIAGNOSIS — Z833 Family history of diabetes mellitus: Secondary | ICD-10-CM | POA: Diagnosis not present

## 2018-01-14 DIAGNOSIS — E785 Hyperlipidemia, unspecified: Secondary | ICD-10-CM | POA: Diagnosis not present

## 2018-01-14 DIAGNOSIS — Z809 Family history of malignant neoplasm, unspecified: Secondary | ICD-10-CM | POA: Diagnosis not present

## 2018-01-14 DIAGNOSIS — Z8249 Family history of ischemic heart disease and other diseases of the circulatory system: Secondary | ICD-10-CM | POA: Diagnosis not present

## 2018-01-14 DIAGNOSIS — Z79899 Other long term (current) drug therapy: Secondary | ICD-10-CM | POA: Diagnosis not present

## 2018-01-14 DIAGNOSIS — N8110 Cystocele, unspecified: Secondary | ICD-10-CM | POA: Diagnosis not present

## 2018-01-14 DIAGNOSIS — Z881 Allergy status to other antibiotic agents status: Secondary | ICD-10-CM | POA: Diagnosis not present

## 2018-01-15 DIAGNOSIS — N8111 Cystocele, midline: Secondary | ICD-10-CM | POA: Diagnosis not present

## 2018-01-15 DIAGNOSIS — Z79899 Other long term (current) drug therapy: Secondary | ICD-10-CM | POA: Diagnosis not present

## 2018-01-15 DIAGNOSIS — N809 Endometriosis, unspecified: Secondary | ICD-10-CM | POA: Diagnosis not present

## 2018-01-15 DIAGNOSIS — Z886 Allergy status to analgesic agent status: Secondary | ICD-10-CM | POA: Diagnosis not present

## 2018-01-15 DIAGNOSIS — N8110 Cystocele, unspecified: Secondary | ICD-10-CM | POA: Diagnosis not present

## 2018-01-15 DIAGNOSIS — E785 Hyperlipidemia, unspecified: Secondary | ICD-10-CM | POA: Diagnosis not present

## 2018-01-15 DIAGNOSIS — Z881 Allergy status to other antibiotic agents status: Secondary | ICD-10-CM | POA: Diagnosis not present

## 2018-01-16 DIAGNOSIS — Z881 Allergy status to other antibiotic agents status: Secondary | ICD-10-CM | POA: Diagnosis not present

## 2018-01-16 DIAGNOSIS — E785 Hyperlipidemia, unspecified: Secondary | ICD-10-CM | POA: Diagnosis not present

## 2018-01-16 DIAGNOSIS — Z886 Allergy status to analgesic agent status: Secondary | ICD-10-CM | POA: Diagnosis not present

## 2018-01-16 DIAGNOSIS — Z79899 Other long term (current) drug therapy: Secondary | ICD-10-CM | POA: Diagnosis not present

## 2018-01-16 DIAGNOSIS — N8110 Cystocele, unspecified: Secondary | ICD-10-CM | POA: Diagnosis not present

## 2018-01-16 DIAGNOSIS — N809 Endometriosis, unspecified: Secondary | ICD-10-CM | POA: Diagnosis not present

## 2018-02-14 DIAGNOSIS — N898 Other specified noninflammatory disorders of vagina: Secondary | ICD-10-CM | POA: Diagnosis not present

## 2018-02-19 ENCOUNTER — Ambulatory Visit (INDEPENDENT_AMBULATORY_CARE_PROVIDER_SITE_OTHER): Payer: Medicare Other | Admitting: Allergy and Immunology

## 2018-02-19 ENCOUNTER — Encounter: Payer: Self-pay | Admitting: Allergy and Immunology

## 2018-02-19 VITALS — BP 114/72 | HR 83 | Temp 98.3°F | Resp 14 | Ht 60.0 in | Wt 147.0 lb

## 2018-02-19 DIAGNOSIS — J3089 Other allergic rhinitis: Secondary | ICD-10-CM

## 2018-02-19 MED ORDER — MONTELUKAST SODIUM 10 MG PO TABS: 10.0000 mg | ORAL_TABLET | Freq: Every day | ORAL | 5 refills | Status: AC

## 2018-02-19 NOTE — Patient Instructions (Addendum)
  1.  Allergen avoidance measures  2.  Treat and prevent inflammation:   A.  OTC budesonide 1 spray each nostril 1 time per day  B.  Montelukast 10 mg tablet 1 time per day  3.  If needed:   A.  OTC nasal saline  B.  OTC antihistamine  4.  Return to clinic in 4 weeks or earlier if problem  5.  Further evaluation and treatment?  Yes if still with symptoms

## 2018-02-19 NOTE — Progress Notes (Signed)
Dear Dr. Willey Blade,  Thank you for referring Cathy Mclean to the [facility] on 02/19/2018.   Below is a summation of this patient's evaluation and recommendations.  Thank you for your referral. I will keep you informed about this patient's response to treatment.   If you have any questions please do not hesitate to contact me.   Sincerely,  Jiles Prows, MD Allergy / Immunology [facility]   ______________________________________________________________________    NEW PATIENT NOTE  Referring Provider: Asencion Noble, MD Primary Provider: Asencion Noble, MD Date of office visit: 02/19/2018    Subjective:   Chief Complaint:  Cathy Mclean (DOB: 1945-12-13) is a 72 y.o. female who presents to the clinic on 02/19/2018 with a chief complaint of New Patient (Initial Visit) .     HPI: Cathy Mclean presents to this clinic in evaluation of allergies.  She has a long history of problems with runny nose and intermittent sneezing and nasal congestion and postnasal drip and sore throat and frontal headache that is pressure-like without any scotoma or nausea or inability to function that appears to occur on an intermittent basis definitely flaring during the spring and fall following exposure to pollens.  She believes that she has progressed regarding this issue over the course of the past several years and she now has symptoms throughout the year.  She has tried various medications in the past including over-the-counter antihistamines and some over-the-counter nasal steroids which she thinks may or may not help her very much.  Past Medical History:  Diagnosis Date  . Elevated cholesterol   . Hemorrhoids 07/30/2013  . Prolapse of vaginal vault after hysterectomy 07/30/2013  . Rectocele 07/30/2013  . UI (urinary incontinence) 07/30/2013  . Vaginal vault prolapse     Past Surgical History:  Procedure  Laterality Date  . ABDOMINAL HYSTERECTOMY    . ANTERIOR AND POSTERIOR REPAIR N/A 10/01/2013   Procedure: ANTERIOR (CYSTOCELE) AND POSTERIOR REPAIR (RECTOCELE);  Surgeon: Jonnie Kind, MD;  Location: [facility];  Service: Gynecology;  Laterality: N/A;  . COLONOSCOPY  07/01/2004   Dr. Gala Romney- normal rectum, L sided diverticula, the remainder of the colonic mucosa appeared normal.  . COLONOSCOPY N/A 09/22/2013   Procedure: COLONOSCOPY;  Surgeon: Danie Binder, MD;  Location: [facility];  Service: Endoscopy;  Laterality: N/A;  1:00 PM  . FOOT SURGERY Left   . FRACTURE SURGERY    . HEMORRHOID BANDING N/A 09/22/2013   Procedure: HEMORRHOID BANDING;  Surgeon: Danie Binder, MD;  Location: [facility];  Service: Endoscopy;  Laterality: N/A;  . TONSILECTOMY, ADENOIDECTOMY, BILATERAL MYRINGOTOMY AND TUBES      Allergies as of 02/19/2018      Reactions   Caffeine Other (See Comments)   Makes patient anxious and shakey.   Clindamycin/lincomycin    Codeine       Medication List      amoxicillin-clavulanate 875-125 MG tablet Commonly known as:  AUGMENTIN Take by mouth.   calcium carbonate 200 MG capsule Take 250 mg by mouth 2 (two) times daily with a meal.   ESTRACE VAGINAL 0.1 MG/GM vaginal cream Generic drug:  estradiol Estrace 0.01% (0.1 mg/gram) vaginal cream  INSERT 1 G APPLICATORFUL VAGINALLY ONCE DAILY   fish oil-omega-3 fatty acids 1000 MG capsule Take 2 g by mouth daily.   lovastatin 40 MG tablet Commonly known as:  MEVACOR Take 40 mg by  mouth at bedtime.   multivitamin capsule Take 1 capsule by mouth daily.   polyethylene glycol powder powder Commonly known as:  MIRALAX Take 17 g by mouth daily. To prevent constipation   Turmeric Curcumin 500 MG Caps Take by mouth.   VITAMIN D PO Take 1 tablet by mouth daily.   zinc gluconate 50 MG tablet Take 50 mg by mouth daily.       Review of systems negative except as noted in HPI / PMHx or noted below:  Review of  Systems  Constitutional: Negative.   HENT: Negative.   Eyes: Negative.   Respiratory: Negative.   Cardiovascular: Negative.   Gastrointestinal: Negative.   Genitourinary: Negative.   Musculoskeletal: Negative.   Skin: Negative.   Neurological: Negative.   Endo/Heme/Allergies: Negative.   Psychiatric/Behavioral: Negative.     Family History  Problem Relation Age of Onset  . Dementia Mother   . Parkinson's disease Mother   . Dementia Father   . Asthma Father   . Heart disease Maternal Grandfather   . Tuberculosis Paternal Grandmother     Social History   Socioeconomic History  . Marital status: Married    Spouse name: Not on file  . Number of children: Not on file  . Years of education: Not on file  . Highest education level: Not on file  Occupational History  . Not on file  Social Needs  . Financial resource strain: Not on file  . Food insecurity:    Worry: Not on file    Inability: Not on file  . Transportation needs:    Medical: Not on file    Non-medical: Not on file  Tobacco Use  . Smoking status: Never Smoker  . Smokeless tobacco: Never Used  . Tobacco comment: Never smoked  Substance and Sexual Activity  . Alcohol use: No  . Drug use: No  . Sexual activity: Yes  Lifestyle  . Physical activity:    Days per week: Not on file    Minutes per session: Not on file  . Stress: Not on file  Relationships  . Social connections:    Talks on phone: Not on file    Gets together: Not on file    Attends religious service: Not on file    Active member of club or organization: Not on file    Attends meetings of clubs or organizations: Not on file    Relationship status: Not on file  . Intimate partner violence:    Fear of current or ex partner: Not on file    Emotionally abused: Not on file    Physically abused: Not on file    Forced sexual activity: Not on file  Other Topics Concern  . Not on file  Social History Narrative  . Not on file     Environmental and Social history  Lives in a house with a dry environment, no animals located inside the household, carpet in the bedroom, plastic on the bed, plastic on the pillow, and no smokers located inside the household.  Objective:   Vitals:   02/19/18 1406  BP: 114/72  Pulse: 83  Resp: 14  Temp: 98.3 F (36.8 C)  SpO2: 96%   Height: 5' (152.4 cm) Weight: 147 lb (66.7 kg)  Physical Exam Constitutional:      Appearance: She is not diaphoretic.  HENT:     Head: Normocephalic. No right periorbital erythema or left periorbital erythema.     Right Ear: Tympanic membrane, ear  canal and external ear normal.     Left Ear: Tympanic membrane, ear canal and external ear normal.     Nose: Nose normal. No mucosal edema or rhinorrhea.     Mouth/Throat:     Pharynx: Uvula midline. No oropharyngeal exudate.  Eyes:     General: Lids are normal.     Conjunctiva/sclera: Conjunctivae normal.     Pupils: Pupils are equal, round, and reactive to light.  Neck:     Thyroid: No thyromegaly.     Trachea: Trachea normal. No tracheal tenderness or tracheal deviation.  Cardiovascular:     Rate and Rhythm: Normal rate and regular rhythm.     Heart sounds: Normal heart sounds, S1 normal and S2 normal. No murmur.  Pulmonary:     Effort: Pulmonary effort is normal. No respiratory distress.     Breath sounds: Normal breath sounds. No stridor. No wheezing or rales.  Chest:     Chest wall: No tenderness.  Abdominal:     General: There is no distension.     Palpations: Abdomen is soft. There is no mass.     Tenderness: There is no abdominal tenderness. There is no guarding or rebound.  Musculoskeletal:        General: No tenderness.  Lymphadenopathy:     Head:     Right side of head: No tonsillar adenopathy.     Left side of head: No tonsillar adenopathy.     Cervical: No cervical adenopathy.  Skin:    Coloration: Skin is not pale.     Findings: No erythema or rash.     Nails: There  is no clubbing.   Neurological:     Mental Status: She is alert.     Diagnostics: Allergy skin tests were performed.  She demonstrated hypersensitivity to mold.  Assessment and Plan:    1. Perennial allergic rhinitis     1.  Allergen avoidance measures  2.  Treat and prevent inflammation:   A.  OTC budesonide 1 spray each nostril 1 time per day  B.  Montelukast 10 mg tablet 1 time per day  3.  If needed:   A.  OTC nasal saline  B.  OTC antihistamine  4.  Return to clinic in 4 weeks or earlier if problem  5.  Further evaluation and treatment?  Yes if still with symptoms  Cathy Mclean does appear to have atopic disease and we will get her to perform allergen avoidance measures as best as possible directed against mold and have her use anti-inflammatory agents for upper airway on a consistent basis as noted above.  I will regroup with her in 4 weeks or earlier if there is a problem.  Should she fail medical therapy she would be a candidate for immunotherapy.  Jiles Prows, MD Allergy / Immunology [place]

## 2018-02-20 ENCOUNTER — Encounter: Payer: Self-pay | Admitting: Allergy and Immunology

## 2018-03-08 DIAGNOSIS — Z1231 Encounter for screening mammogram for malignant neoplasm of breast: Secondary | ICD-10-CM | POA: Diagnosis not present

## 2018-04-02 DIAGNOSIS — L821 Other seborrheic keratosis: Secondary | ICD-10-CM | POA: Diagnosis not present

## 2018-04-02 DIAGNOSIS — D485 Neoplasm of uncertain behavior of skin: Secondary | ICD-10-CM | POA: Diagnosis not present

## 2018-04-02 DIAGNOSIS — L719 Rosacea, unspecified: Secondary | ICD-10-CM | POA: Diagnosis not present

## 2018-04-02 DIAGNOSIS — Z85828 Personal history of other malignant neoplasm of skin: Secondary | ICD-10-CM | POA: Diagnosis not present

## 2018-04-02 DIAGNOSIS — L814 Other melanin hyperpigmentation: Secondary | ICD-10-CM | POA: Diagnosis not present

## 2018-04-02 DIAGNOSIS — Z411 Encounter for cosmetic surgery: Secondary | ICD-10-CM | POA: Diagnosis not present

## 2018-04-02 DIAGNOSIS — C4441 Basal cell carcinoma of skin of scalp and neck: Secondary | ICD-10-CM | POA: Diagnosis not present

## 2018-04-02 DIAGNOSIS — D225 Melanocytic nevi of trunk: Secondary | ICD-10-CM | POA: Diagnosis not present

## 2018-04-09 ENCOUNTER — Ambulatory Visit (INDEPENDENT_AMBULATORY_CARE_PROVIDER_SITE_OTHER): Payer: Medicare Other | Admitting: Allergy and Immunology

## 2018-04-09 ENCOUNTER — Encounter: Payer: Self-pay | Admitting: Allergy and Immunology

## 2018-04-09 VITALS — BP 134/72 | HR 78 | Resp 18

## 2018-04-09 DIAGNOSIS — J3089 Other allergic rhinitis: Secondary | ICD-10-CM

## 2018-04-09 DIAGNOSIS — K219 Gastro-esophageal reflux disease without esophagitis: Secondary | ICD-10-CM | POA: Diagnosis not present

## 2018-04-09 NOTE — Patient Instructions (Addendum)
  1.  Allergen avoidance measures - molds  2.  Continue to Treat and prevent inflammation:   A.  OTC budesonide 1 spray each nostril 1 time per day  B.  Montelukast 10 mg tablet 1 time per day  C.  Prednisone 10mg  - 1 tablet 1 time per day for 10 days only  3. Treat and prevent reflux:   A. Minimize caffeine and chocolate consumption  B. Omeprazole 40mg  - 1 tablet 1 time per day  4.  If needed:   A.  OTC nasal saline  B.  OTC antihistamine  5.  Return to clinic in 8 weeks or earlier if problem

## 2018-04-09 NOTE — Progress Notes (Signed)
Follow-up Note  Referring Provider: Asencion Noble, MD Primary Provider: Asencion Noble, MD Date of Office Visit: 04/09/2018  Subjective:   Cathy Mclean (DOB: 1946/02/10) is a 73 y.o. female who returns to the [facility] on 04/09/2018 in re-evaluation of the following:  HPI: Cathy Mclean returns to this clinic in reevaluation of her allergic rhinitis addressed during her initial evaluation of 19 February 2018.  She does not really think that the combination of nasal budesonide and montelukast has helped her very much.  She still has nasal congestion and runny nose and burning throat and drainage.  It should be noted that she does have reflux up into her throat at least 1 time per week especially if she eats spicy food.  She does not have any anosmia or ugly nasal discharge or headaches.  Allergies as of 04/09/2018      Reactions   Caffeine Other (See Comments)   Makes patient anxious and shakey.   Clindamycin/lincomycin    Codeine       Medication List      calcium carbonate 200 MG capsule Take 250 mg by mouth 2 (two) times daily with a meal.   EQ BUDESONIDE NASAL 32 MCG/ACT nasal spray Generic drug:  budesonide Place into both nostrils daily.   fish oil-omega-3 fatty acids 1000 MG capsule Take 2 g by mouth daily.   lovastatin 40 MG tablet Commonly known as:  MEVACOR Take 40 mg by mouth at bedtime.   montelukast 10 MG tablet Commonly known as:  SINGULAIR Take 1 tablet (10 mg total) by mouth at bedtime.   multivitamin capsule Take 1 capsule by mouth daily.   OVER THE COUNTER MEDICATION Apply 1 application topically at bedtime. Natural Progesterone   OVER THE COUNTER MEDICATION Apply 1 application topically every morning. BiEstro-Care   polyethylene glycol powder powder Commonly known as:  MIRALAX Take 17 g by mouth daily. To prevent constipation   Turmeric Curcumin 500 MG Caps Take by mouth.   VITAMIN D PO Take 1 tablet by mouth daily.   zinc  gluconate 50 MG tablet Take 50 mg by mouth daily.       Past Medical History:  Diagnosis Date  . Elevated cholesterol   . Hemorrhoids 07/30/2013  . Prolapse of vaginal vault after hysterectomy 07/30/2013  . Rectocele 07/30/2013  . UI (urinary incontinence) 07/30/2013  . Vaginal vault prolapse     Past Surgical History:  Procedure Laterality Date  . ABDOMINAL HYSTERECTOMY    . ANTERIOR AND POSTERIOR REPAIR N/A 10/01/2013   Procedure: ANTERIOR (CYSTOCELE) AND POSTERIOR REPAIR (RECTOCELE);  Surgeon: Jonnie Kind, MD;  Location: [facility];  Service: Gynecology;  Laterality: N/A;  . COLONOSCOPY  07/01/2004   Dr. Gala Romney- normal rectum, L sided diverticula, the remainder of the colonic mucosa appeared normal.  . COLONOSCOPY N/A 09/22/2013   Procedure: COLONOSCOPY;  Surgeon: Danie Binder, MD;  Location: [facility];  Service: Endoscopy;  Laterality: N/A;  1:00 PM  . FOOT SURGERY Left   . FRACTURE SURGERY    . HEMORRHOID BANDING N/A 09/22/2013   Procedure: HEMORRHOID BANDING;  Surgeon: Danie Binder, MD;  [facility]: [facility];  Service: Endoscopy;  Laterality: N/A;  . TONSILECTOMY, ADENOIDECTOMY, BILATERAL MYRINGOTOMY AND TUBES      Review of systems negative except as noted in HPI / PMHx or noted below:  Review of Systems  Constitutional: Negative.   HENT: Negative.   Eyes: Negative.   Respiratory:  Negative.   Cardiovascular: Negative.   Gastrointestinal: Negative.   Genitourinary: Negative.   Musculoskeletal: Negative.   Skin: Negative.   Neurological: Negative.   Endo/Heme/Allergies: Negative.   Psychiatric/Behavioral: Negative.      Objective:   Vitals:   04/09/18 1532  BP: 134/72  Pulse: 78  Resp: 18  SpO2: 99%          Physical Exam Constitutional:      Appearance: She is not diaphoretic.  HENT:     Head: Normocephalic.     Right Ear: Tympanic membrane, ear canal and external ear normal.     Left Ear: Tympanic membrane, ear canal and external ear  normal.     Nose: Nose normal. No mucosal edema or rhinorrhea.     Mouth/Throat:     Pharynx: Uvula midline. No oropharyngeal exudate.  Eyes:     Conjunctiva/sclera: Conjunctivae normal.  Neck:     Thyroid: No thyromegaly.     Trachea: Trachea normal. No tracheal tenderness or tracheal deviation.  Cardiovascular:     Rate and Rhythm: Normal rate and regular rhythm.     Heart sounds: Normal heart sounds, S1 normal and S2 normal. No murmur.  Pulmonary:     Effort: No respiratory distress.     Breath sounds: Normal breath sounds. No stridor. No wheezing or rales.  Lymphadenopathy:     Head:     Right side of head: No tonsillar adenopathy.     Left side of head: No tonsillar adenopathy.     Cervical: No cervical adenopathy.  Skin:    Findings: No erythema or rash.     Nails: There is no clubbing.   Neurological:     Mental Status: She is alert.     Diagnostics: none  Assessment and Plan:   1. Perennial allergic rhinitis   2. Gastroesophageal reflux disease, esophagitis presence not specified     1.  Allergen avoidance measures - molds  2.  Continue to Treat and prevent inflammation:   A.  OTC budesonide 1 spray each nostril 1 time per day  B.  Montelukast 10 mg tablet 1 time per day  C.  Prednisone 10mg  - 1 tablet 1 time per day for 10 days only  3. Treat and prevent reflux:   A. Minimize caffeine and chocolate consumption  B. Omeprazole 40mg  - 1 tablet 1 time per day  4.  If needed:   A.  OTC nasal saline  B.  OTC antihistamine  5.  Return to clinic in 8 weeks or earlier if problem  [name] still remains with symptoms consistent with irritation and inflammation of her airway.  Because she does have reflux disease we will treat this issue at this point assuming that there may be a reflux trigger giving rise to this airway issue while she remains on anti-inflammatory agents for her airway.  I will regroup with her in 8 weeks to make an assessment of her response to  this approach and consider further evaluation treatment based upon this response.  Allena Katz, MD Allergy / Immunology [place]

## 2018-04-10 ENCOUNTER — Encounter: Payer: Self-pay | Admitting: Allergy and Immunology

## 2018-05-01 DIAGNOSIS — E785 Hyperlipidemia, unspecified: Secondary | ICD-10-CM | POA: Diagnosis not present

## 2018-05-01 DIAGNOSIS — Z79899 Other long term (current) drug therapy: Secondary | ICD-10-CM | POA: Diagnosis not present

## 2018-05-01 DIAGNOSIS — M1990 Unspecified osteoarthritis, unspecified site: Secondary | ICD-10-CM | POA: Diagnosis not present

## 2018-05-02 DIAGNOSIS — H35363 Drusen (degenerative) of macula, bilateral: Secondary | ICD-10-CM | POA: Diagnosis not present

## 2018-05-02 DIAGNOSIS — H524 Presbyopia: Secondary | ICD-10-CM | POA: Diagnosis not present

## 2018-05-02 DIAGNOSIS — D3132 Benign neoplasm of left choroid: Secondary | ICD-10-CM | POA: Diagnosis not present

## 2018-05-02 DIAGNOSIS — H2513 Age-related nuclear cataract, bilateral: Secondary | ICD-10-CM | POA: Diagnosis not present

## 2018-05-08 DIAGNOSIS — E785 Hyperlipidemia, unspecified: Secondary | ICD-10-CM | POA: Diagnosis not present

## 2018-05-08 DIAGNOSIS — Z6830 Body mass index (BMI) 30.0-30.9, adult: Secondary | ICD-10-CM | POA: Diagnosis not present

## 2018-05-08 DIAGNOSIS — M8580 Other specified disorders of bone density and structure, unspecified site: Secondary | ICD-10-CM | POA: Diagnosis not present

## 2018-05-09 DIAGNOSIS — C4441 Basal cell carcinoma of skin of scalp and neck: Secondary | ICD-10-CM | POA: Diagnosis not present

## 2018-05-09 DIAGNOSIS — C44219 Basal cell carcinoma of skin of left ear and external auricular canal: Secondary | ICD-10-CM | POA: Diagnosis not present

## 2018-05-16 DIAGNOSIS — R350 Frequency of micturition: Secondary | ICD-10-CM | POA: Diagnosis not present

## 2018-05-16 DIAGNOSIS — N3946 Mixed incontinence: Secondary | ICD-10-CM | POA: Diagnosis not present

## 2018-05-16 DIAGNOSIS — R351 Nocturia: Secondary | ICD-10-CM | POA: Diagnosis not present

## 2018-05-23 DIAGNOSIS — L570 Actinic keratosis: Secondary | ICD-10-CM | POA: Diagnosis not present

## 2018-05-23 DIAGNOSIS — Z23 Encounter for immunization: Secondary | ICD-10-CM | POA: Diagnosis not present

## 2018-05-23 DIAGNOSIS — Z85828 Personal history of other malignant neoplasm of skin: Secondary | ICD-10-CM | POA: Diagnosis not present

## 2018-05-23 DIAGNOSIS — Z4802 Encounter for removal of sutures: Secondary | ICD-10-CM | POA: Diagnosis not present

## 2018-06-04 ENCOUNTER — Ambulatory Visit: Payer: Medicare Other | Admitting: Allergy and Immunology

## 2018-07-03 DIAGNOSIS — R350 Frequency of micturition: Secondary | ICD-10-CM | POA: Diagnosis not present

## 2018-07-03 DIAGNOSIS — N3946 Mixed incontinence: Secondary | ICD-10-CM | POA: Diagnosis not present

## 2018-07-09 DIAGNOSIS — N3946 Mixed incontinence: Secondary | ICD-10-CM | POA: Diagnosis not present

## 2018-07-23 DIAGNOSIS — N3946 Mixed incontinence: Secondary | ICD-10-CM | POA: Diagnosis not present

## 2018-07-23 DIAGNOSIS — R350 Frequency of micturition: Secondary | ICD-10-CM | POA: Diagnosis not present

## 2018-08-05 DIAGNOSIS — H2512 Age-related nuclear cataract, left eye: Secondary | ICD-10-CM | POA: Diagnosis not present

## 2018-08-05 DIAGNOSIS — H2513 Age-related nuclear cataract, bilateral: Secondary | ICD-10-CM | POA: Diagnosis not present

## 2018-08-19 DIAGNOSIS — H2512 Age-related nuclear cataract, left eye: Secondary | ICD-10-CM | POA: Diagnosis not present

## 2018-08-19 DIAGNOSIS — H25812 Combined forms of age-related cataract, left eye: Secondary | ICD-10-CM | POA: Diagnosis not present

## 2018-08-27 DIAGNOSIS — H2511 Age-related nuclear cataract, right eye: Secondary | ICD-10-CM | POA: Diagnosis not present

## 2018-09-04 DIAGNOSIS — N8111 Cystocele, midline: Secondary | ICD-10-CM | POA: Diagnosis not present

## 2018-09-04 DIAGNOSIS — N3946 Mixed incontinence: Secondary | ICD-10-CM | POA: Diagnosis not present

## 2018-09-09 DIAGNOSIS — Z961 Presence of intraocular lens: Secondary | ICD-10-CM | POA: Diagnosis not present

## 2018-09-09 DIAGNOSIS — H2511 Age-related nuclear cataract, right eye: Secondary | ICD-10-CM | POA: Diagnosis not present

## 2018-09-09 DIAGNOSIS — H25811 Combined forms of age-related cataract, right eye: Secondary | ICD-10-CM | POA: Diagnosis not present

## 2018-11-20 DIAGNOSIS — N952 Postmenopausal atrophic vaginitis: Secondary | ICD-10-CM | POA: Diagnosis not present

## 2018-11-20 DIAGNOSIS — N3946 Mixed incontinence: Secondary | ICD-10-CM | POA: Diagnosis not present

## 2018-11-20 DIAGNOSIS — N993 Prolapse of vaginal vault after hysterectomy: Secondary | ICD-10-CM | POA: Diagnosis not present

## 2018-11-20 DIAGNOSIS — N398 Other specified disorders of urinary system: Secondary | ICD-10-CM | POA: Diagnosis not present

## 2018-11-22 DIAGNOSIS — Z23 Encounter for immunization: Secondary | ICD-10-CM | POA: Diagnosis not present

## 2018-12-12 DIAGNOSIS — R140 Abdominal distension (gaseous): Secondary | ICD-10-CM | POA: Diagnosis not present

## 2018-12-12 DIAGNOSIS — R142 Eructation: Secondary | ICD-10-CM | POA: Diagnosis not present

## 2018-12-12 DIAGNOSIS — K5900 Constipation, unspecified: Secondary | ICD-10-CM | POA: Diagnosis not present

## 2018-12-18 DIAGNOSIS — N952 Postmenopausal atrophic vaginitis: Secondary | ICD-10-CM | POA: Diagnosis not present

## 2018-12-18 DIAGNOSIS — R142 Eructation: Secondary | ICD-10-CM | POA: Diagnosis not present

## 2018-12-18 DIAGNOSIS — N3941 Urge incontinence: Secondary | ICD-10-CM | POA: Diagnosis not present

## 2018-12-18 DIAGNOSIS — N993 Prolapse of vaginal vault after hysterectomy: Secondary | ICD-10-CM | POA: Diagnosis not present

## 2018-12-18 DIAGNOSIS — N8110 Cystocele, unspecified: Secondary | ICD-10-CM | POA: Diagnosis not present

## 2018-12-27 DIAGNOSIS — N8110 Cystocele, unspecified: Secondary | ICD-10-CM | POA: Diagnosis not present

## 2018-12-27 DIAGNOSIS — Z20828 Contact with and (suspected) exposure to other viral communicable diseases: Secondary | ICD-10-CM | POA: Diagnosis not present

## 2018-12-27 DIAGNOSIS — N3946 Mixed incontinence: Secondary | ICD-10-CM | POA: Diagnosis not present

## 2018-12-27 DIAGNOSIS — Z01812 Encounter for preprocedural laboratory examination: Secondary | ICD-10-CM | POA: Diagnosis not present

## 2018-12-27 DIAGNOSIS — N3281 Overactive bladder: Secondary | ICD-10-CM | POA: Diagnosis not present

## 2019-01-03 DIAGNOSIS — N3281 Overactive bladder: Secondary | ICD-10-CM | POA: Diagnosis not present

## 2019-01-03 DIAGNOSIS — N812 Incomplete uterovaginal prolapse: Secondary | ICD-10-CM | POA: Diagnosis not present

## 2019-01-03 DIAGNOSIS — N3946 Mixed incontinence: Secondary | ICD-10-CM | POA: Diagnosis not present

## 2019-01-03 DIAGNOSIS — N8110 Cystocele, unspecified: Secondary | ICD-10-CM | POA: Diagnosis not present

## 2019-01-03 DIAGNOSIS — Z9889 Other specified postprocedural states: Secondary | ICD-10-CM | POA: Diagnosis not present

## 2019-01-20 DIAGNOSIS — K5900 Constipation, unspecified: Secondary | ICD-10-CM | POA: Diagnosis not present

## 2019-01-20 DIAGNOSIS — R140 Abdominal distension (gaseous): Secondary | ICD-10-CM | POA: Diagnosis not present

## 2019-01-20 DIAGNOSIS — R142 Eructation: Secondary | ICD-10-CM | POA: Diagnosis not present

## 2019-01-21 ENCOUNTER — Other Ambulatory Visit: Payer: Self-pay | Admitting: Gastroenterology

## 2019-01-21 DIAGNOSIS — R140 Abdominal distension (gaseous): Secondary | ICD-10-CM

## 2019-02-03 ENCOUNTER — Other Ambulatory Visit: Payer: Self-pay | Admitting: Gastroenterology

## 2019-02-03 ENCOUNTER — Ambulatory Visit
Admission: RE | Admit: 2019-02-03 | Discharge: 2019-02-03 | Disposition: A | Discharge status: Home / Self Care | Payer: Medicare Other | Source: Ambulatory Visit | Attending: Gastroenterology | Admitting: Gastroenterology

## 2019-02-03 DIAGNOSIS — R140 Abdominal distension (gaseous): Secondary | ICD-10-CM

## 2019-02-12 DIAGNOSIS — N3946 Mixed incontinence: Secondary | ICD-10-CM | POA: Diagnosis not present

## 2019-03-21 DIAGNOSIS — Z1231 Encounter for screening mammogram for malignant neoplasm of breast: Secondary | ICD-10-CM | POA: Diagnosis not present

## 2019-04-01 DIAGNOSIS — N6002 Solitary cyst of left breast: Secondary | ICD-10-CM | POA: Diagnosis not present

## 2019-04-10 DIAGNOSIS — Z23 Encounter for immunization: Secondary | ICD-10-CM | POA: Diagnosis not present

## 2019-04-14 DIAGNOSIS — L570 Actinic keratosis: Secondary | ICD-10-CM | POA: Diagnosis not present

## 2019-04-14 DIAGNOSIS — Z23 Encounter for immunization: Secondary | ICD-10-CM | POA: Diagnosis not present

## 2019-04-14 DIAGNOSIS — L820 Inflamed seborrheic keratosis: Secondary | ICD-10-CM | POA: Diagnosis not present

## 2019-04-14 DIAGNOSIS — L578 Other skin changes due to chronic exposure to nonionizing radiation: Secondary | ICD-10-CM | POA: Diagnosis not present

## 2019-04-14 DIAGNOSIS — D225 Melanocytic nevi of trunk: Secondary | ICD-10-CM | POA: Diagnosis not present

## 2019-04-14 DIAGNOSIS — L821 Other seborrheic keratosis: Secondary | ICD-10-CM | POA: Diagnosis not present

## 2019-04-14 DIAGNOSIS — L814 Other melanin hyperpigmentation: Secondary | ICD-10-CM | POA: Diagnosis not present

## 2019-04-14 DIAGNOSIS — Z85828 Personal history of other malignant neoplasm of skin: Secondary | ICD-10-CM | POA: Diagnosis not present

## 2019-04-21 DIAGNOSIS — D3132 Benign neoplasm of left choroid: Secondary | ICD-10-CM | POA: Diagnosis not present

## 2019-04-21 DIAGNOSIS — H26493 Other secondary cataract, bilateral: Secondary | ICD-10-CM | POA: Diagnosis not present

## 2019-04-21 DIAGNOSIS — H35363 Drusen (degenerative) of macula, bilateral: Secondary | ICD-10-CM | POA: Diagnosis not present

## 2019-04-21 DIAGNOSIS — Z961 Presence of intraocular lens: Secondary | ICD-10-CM | POA: Diagnosis not present

## 2019-05-09 DIAGNOSIS — Z23 Encounter for immunization: Secondary | ICD-10-CM | POA: Diagnosis not present

## 2019-05-14 DIAGNOSIS — Z79899 Other long term (current) drug therapy: Secondary | ICD-10-CM | POA: Diagnosis not present

## 2019-05-14 DIAGNOSIS — E785 Hyperlipidemia, unspecified: Secondary | ICD-10-CM | POA: Diagnosis not present

## 2019-05-14 DIAGNOSIS — M2570 Osteophyte, unspecified joint: Secondary | ICD-10-CM | POA: Diagnosis not present

## 2019-05-21 DIAGNOSIS — E785 Hyperlipidemia, unspecified: Secondary | ICD-10-CM | POA: Diagnosis not present

## 2019-05-21 DIAGNOSIS — M722 Plantar fascial fibromatosis: Secondary | ICD-10-CM | POA: Diagnosis not present

## 2019-06-27 DIAGNOSIS — R142 Eructation: Secondary | ICD-10-CM | POA: Diagnosis not present

## 2019-06-27 DIAGNOSIS — K5900 Constipation, unspecified: Secondary | ICD-10-CM | POA: Diagnosis not present

## 2019-06-27 DIAGNOSIS — K5710 Diverticulosis of small intestine without perforation or abscess without bleeding: Secondary | ICD-10-CM | POA: Diagnosis not present

## 2019-06-27 DIAGNOSIS — R140 Abdominal distension (gaseous): Secondary | ICD-10-CM | POA: Diagnosis not present

## 2019-08-12 DIAGNOSIS — Z1159 Encounter for screening for other viral diseases: Secondary | ICD-10-CM | POA: Diagnosis not present

## 2019-08-15 DIAGNOSIS — K228 Other specified diseases of esophagus: Secondary | ICD-10-CM | POA: Diagnosis not present

## 2019-08-15 DIAGNOSIS — R140 Abdominal distension (gaseous): Secondary | ICD-10-CM | POA: Diagnosis not present

## 2019-08-15 DIAGNOSIS — K2980 Duodenitis without bleeding: Secondary | ICD-10-CM | POA: Diagnosis not present

## 2019-08-15 DIAGNOSIS — K2930 Chronic superficial gastritis without bleeding: Secondary | ICD-10-CM | POA: Diagnosis not present

## 2019-08-15 DIAGNOSIS — K2970 Gastritis, unspecified, without bleeding: Secondary | ICD-10-CM | POA: Diagnosis not present

## 2019-08-15 DIAGNOSIS — K219 Gastro-esophageal reflux disease without esophagitis: Secondary | ICD-10-CM | POA: Diagnosis not present

## 2019-08-15 DIAGNOSIS — K5710 Diverticulosis of small intestine without perforation or abscess without bleeding: Secondary | ICD-10-CM | POA: Diagnosis not present

## 2019-08-22 DIAGNOSIS — K2980 Duodenitis without bleeding: Secondary | ICD-10-CM | POA: Diagnosis not present

## 2019-08-22 DIAGNOSIS — K2930 Chronic superficial gastritis without bleeding: Secondary | ICD-10-CM | POA: Diagnosis not present

## 2019-08-22 DIAGNOSIS — K219 Gastro-esophageal reflux disease without esophagitis: Secondary | ICD-10-CM | POA: Diagnosis not present

## 2019-10-22 DIAGNOSIS — H26493 Other secondary cataract, bilateral: Secondary | ICD-10-CM | POA: Diagnosis not present

## 2019-10-22 DIAGNOSIS — D3132 Benign neoplasm of left choroid: Secondary | ICD-10-CM | POA: Diagnosis not present

## 2019-10-22 DIAGNOSIS — Z961 Presence of intraocular lens: Secondary | ICD-10-CM | POA: Diagnosis not present

## 2019-10-22 DIAGNOSIS — H353111 Nonexudative age-related macular degeneration, right eye, early dry stage: Secondary | ICD-10-CM | POA: Diagnosis not present

## 2019-10-22 DIAGNOSIS — H353122 Nonexudative age-related macular degeneration, left eye, intermediate dry stage: Secondary | ICD-10-CM | POA: Diagnosis not present

## 2019-10-27 DIAGNOSIS — R140 Abdominal distension (gaseous): Secondary | ICD-10-CM | POA: Diagnosis not present

## 2019-10-27 DIAGNOSIS — K5710 Diverticulosis of small intestine without perforation or abscess without bleeding: Secondary | ICD-10-CM | POA: Diagnosis not present

## 2019-10-27 DIAGNOSIS — R142 Eructation: Secondary | ICD-10-CM | POA: Diagnosis not present

## 2019-11-19 DIAGNOSIS — H26493 Other secondary cataract, bilateral: Secondary | ICD-10-CM | POA: Diagnosis not present

## 2019-12-01 DIAGNOSIS — Z23 Encounter for immunization: Secondary | ICD-10-CM | POA: Diagnosis not present

## 2019-12-22 DIAGNOSIS — L570 Actinic keratosis: Secondary | ICD-10-CM | POA: Diagnosis not present

## 2019-12-22 DIAGNOSIS — L820 Inflamed seborrheic keratosis: Secondary | ICD-10-CM | POA: Diagnosis not present

## 2019-12-22 DIAGNOSIS — L719 Rosacea, unspecified: Secondary | ICD-10-CM | POA: Diagnosis not present

## 2019-12-30 DIAGNOSIS — Z23 Encounter for immunization: Secondary | ICD-10-CM | POA: Diagnosis not present

## 2020-03-10 DIAGNOSIS — H02834 Dermatochalasis of left upper eyelid: Secondary | ICD-10-CM | POA: Diagnosis not present

## 2020-03-10 DIAGNOSIS — H02832 Dermatochalasis of right lower eyelid: Secondary | ICD-10-CM | POA: Diagnosis not present

## 2020-03-10 DIAGNOSIS — L988 Other specified disorders of the skin and subcutaneous tissue: Secondary | ICD-10-CM | POA: Diagnosis not present

## 2020-03-10 DIAGNOSIS — H02831 Dermatochalasis of right upper eyelid: Secondary | ICD-10-CM | POA: Diagnosis not present

## 2020-03-10 DIAGNOSIS — H02835 Dermatochalasis of left lower eyelid: Secondary | ICD-10-CM | POA: Diagnosis not present

## 2020-03-24 DIAGNOSIS — N3946 Mixed incontinence: Secondary | ICD-10-CM | POA: Diagnosis not present

## 2020-03-24 DIAGNOSIS — N3281 Overactive bladder: Secondary | ICD-10-CM | POA: Diagnosis not present

## 2020-04-05 DIAGNOSIS — N6321 Unspecified lump in the left breast, upper outer quadrant: Secondary | ICD-10-CM | POA: Diagnosis not present

## 2020-04-05 DIAGNOSIS — N6002 Solitary cyst of left breast: Secondary | ICD-10-CM | POA: Diagnosis not present

## 2020-04-05 DIAGNOSIS — R928 Other abnormal and inconclusive findings on diagnostic imaging of breast: Secondary | ICD-10-CM | POA: Diagnosis not present

## 2020-05-05 DIAGNOSIS — Z85828 Personal history of other malignant neoplasm of skin: Secondary | ICD-10-CM | POA: Diagnosis not present

## 2020-05-05 DIAGNOSIS — L814 Other melanin hyperpigmentation: Secondary | ICD-10-CM | POA: Diagnosis not present

## 2020-05-05 DIAGNOSIS — D225 Melanocytic nevi of trunk: Secondary | ICD-10-CM | POA: Diagnosis not present

## 2020-05-05 DIAGNOSIS — L578 Other skin changes due to chronic exposure to nonionizing radiation: Secondary | ICD-10-CM | POA: Diagnosis not present

## 2020-05-05 DIAGNOSIS — L719 Rosacea, unspecified: Secondary | ICD-10-CM | POA: Diagnosis not present

## 2020-05-05 DIAGNOSIS — L821 Other seborrheic keratosis: Secondary | ICD-10-CM | POA: Diagnosis not present

## 2020-05-20 DIAGNOSIS — E785 Hyperlipidemia, unspecified: Secondary | ICD-10-CM | POA: Diagnosis not present

## 2020-05-20 DIAGNOSIS — Z79899 Other long term (current) drug therapy: Secondary | ICD-10-CM | POA: Diagnosis not present

## 2020-05-20 DIAGNOSIS — M859 Disorder of bone density and structure, unspecified: Secondary | ICD-10-CM | POA: Diagnosis not present

## 2020-05-27 DIAGNOSIS — E785 Hyperlipidemia, unspecified: Secondary | ICD-10-CM | POA: Diagnosis not present

## 2020-05-27 DIAGNOSIS — K449 Diaphragmatic hernia without obstruction or gangrene: Secondary | ICD-10-CM | POA: Diagnosis not present

## 2020-05-27 DIAGNOSIS — M722 Plantar fascial fibromatosis: Secondary | ICD-10-CM | POA: Diagnosis not present

## 2020-06-23 DIAGNOSIS — N3281 Overactive bladder: Secondary | ICD-10-CM | POA: Diagnosis not present

## 2020-06-23 DIAGNOSIS — R351 Nocturia: Secondary | ICD-10-CM | POA: Diagnosis not present

## 2020-07-05 DIAGNOSIS — H35722 Serous detachment of retinal pigment epithelium, left eye: Secondary | ICD-10-CM | POA: Diagnosis not present

## 2020-07-05 DIAGNOSIS — H02831 Dermatochalasis of right upper eyelid: Secondary | ICD-10-CM | POA: Diagnosis not present

## 2020-07-05 DIAGNOSIS — Z961 Presence of intraocular lens: Secondary | ICD-10-CM | POA: Diagnosis not present

## 2020-07-05 DIAGNOSIS — H02834 Dermatochalasis of left upper eyelid: Secondary | ICD-10-CM | POA: Diagnosis not present

## 2020-07-05 DIAGNOSIS — D3132 Benign neoplasm of left choroid: Secondary | ICD-10-CM | POA: Diagnosis not present

## 2020-08-31 DIAGNOSIS — Z23 Encounter for immunization: Secondary | ICD-10-CM | POA: Diagnosis not present

## 2020-12-13 DIAGNOSIS — Z23 Encounter for immunization: Secondary | ICD-10-CM | POA: Diagnosis not present

## 2021-02-07 DIAGNOSIS — H43393 Other vitreous opacities, bilateral: Secondary | ICD-10-CM | POA: Diagnosis not present

## 2021-02-07 DIAGNOSIS — D3132 Benign neoplasm of left choroid: Secondary | ICD-10-CM | POA: Diagnosis not present

## 2021-02-07 DIAGNOSIS — H35722 Serous detachment of retinal pigment epithelium, left eye: Secondary | ICD-10-CM | POA: Diagnosis not present

## 2021-02-07 DIAGNOSIS — H02831 Dermatochalasis of right upper eyelid: Secondary | ICD-10-CM | POA: Diagnosis not present

## 2021-04-03 DIAGNOSIS — M859 Disorder of bone density and structure, unspecified: Secondary | ICD-10-CM | POA: Diagnosis not present

## 2021-04-03 DIAGNOSIS — E785 Hyperlipidemia, unspecified: Secondary | ICD-10-CM | POA: Diagnosis not present

## 2021-04-03 DIAGNOSIS — Z0001 Encounter for general adult medical examination with abnormal findings: Secondary | ICD-10-CM | POA: Diagnosis not present

## 2021-05-24 DIAGNOSIS — M722 Plantar fascial fibromatosis: Secondary | ICD-10-CM | POA: Diagnosis not present

## 2021-05-24 DIAGNOSIS — Z79899 Other long term (current) drug therapy: Secondary | ICD-10-CM | POA: Diagnosis not present

## 2021-05-24 DIAGNOSIS — E785 Hyperlipidemia, unspecified: Secondary | ICD-10-CM | POA: Diagnosis not present

## 2021-05-30 DIAGNOSIS — J309 Allergic rhinitis, unspecified: Secondary | ICD-10-CM | POA: Diagnosis not present

## 2021-05-30 DIAGNOSIS — Z0000 Encounter for general adult medical examination without abnormal findings: Secondary | ICD-10-CM | POA: Diagnosis not present

## 2021-05-30 DIAGNOSIS — Z6828 Body mass index (BMI) 28.0-28.9, adult: Secondary | ICD-10-CM | POA: Diagnosis not present

## 2021-05-30 DIAGNOSIS — Z0001 Encounter for general adult medical examination with abnormal findings: Secondary | ICD-10-CM | POA: Diagnosis not present

## 2021-05-30 DIAGNOSIS — E785 Hyperlipidemia, unspecified: Secondary | ICD-10-CM | POA: Diagnosis not present

## 2021-05-30 DIAGNOSIS — Q401 Congenital hiatus hernia: Secondary | ICD-10-CM | POA: Diagnosis not present

## 2021-06-09 DIAGNOSIS — N3090 Cystitis, unspecified without hematuria: Secondary | ICD-10-CM | POA: Diagnosis not present

## 2021-06-13 DIAGNOSIS — Z1231 Encounter for screening mammogram for malignant neoplasm of breast: Secondary | ICD-10-CM | POA: Diagnosis not present

## 2021-08-06 DIAGNOSIS — N390 Urinary tract infection, site not specified: Secondary | ICD-10-CM | POA: Diagnosis not present

## 2021-08-06 DIAGNOSIS — R350 Frequency of micturition: Secondary | ICD-10-CM | POA: Diagnosis not present

## 2021-08-17 IMAGING — RF DG UGI W/ SMALL BOWEL
6 of 7 series · 14 of 24 positions shown · non-contrast
Comparison: None.

CLINICAL DATA: Abdominal bloating.

EXAM:
UPPER GI SERIES WITH SMALL BOWEL FOLLOW-THROUGH
FLUOROSCOPY TIME:  Fluoroscopy Time:  2 minutes 42 seconds
Radiation Exposure Index (if provided by the fluoroscopic device):
429 mGy
Number of Acquired Spot Images: 0
TECHNIQUE: Combined double contrast and single contrast upper GI series using
effervescent crystals, thick barium, and thin barium. Subsequently,
serial images of the small bowel were obtained including spot views
of the terminal ileum.

[Series 1: one shot · 0.14mm/px · 3 of 6 slices shown (1 of 4)]
[im 1/6]
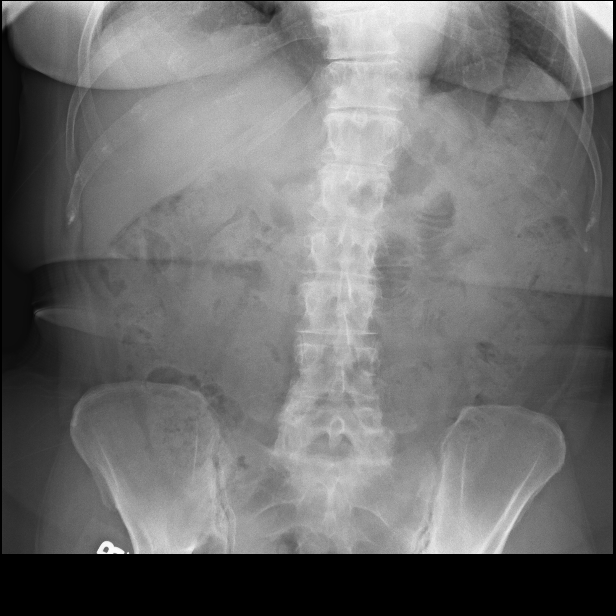
[im 3/6]
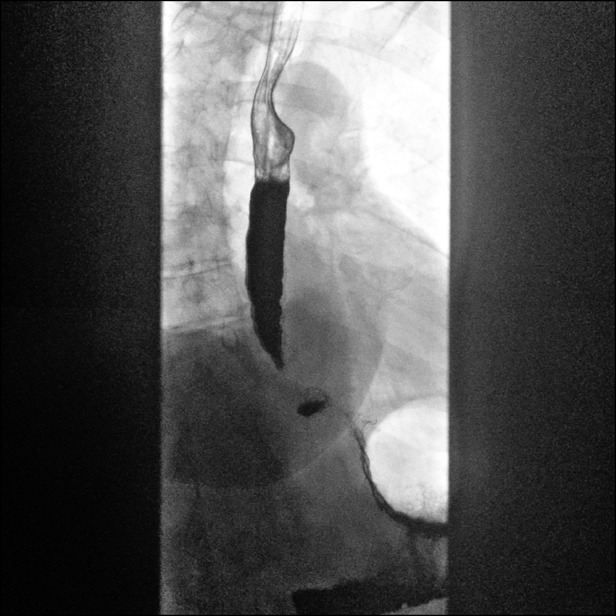
[im 6/6]
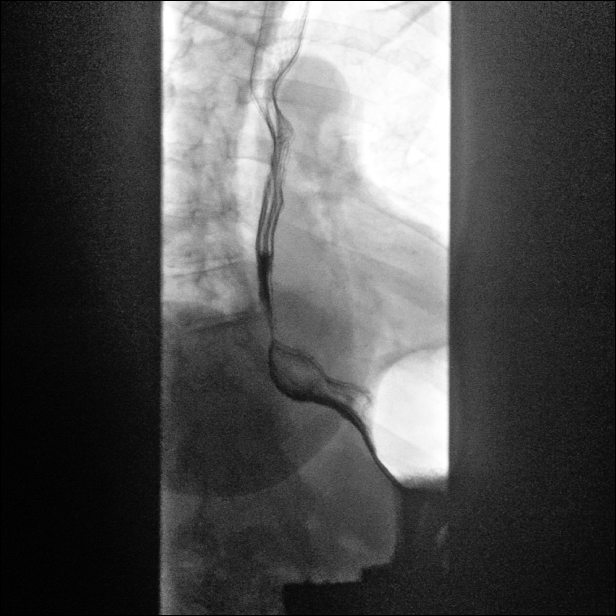

[Series 3: one shot · 3 of 6 slices shown (2 of 4)]
[im 1/6]
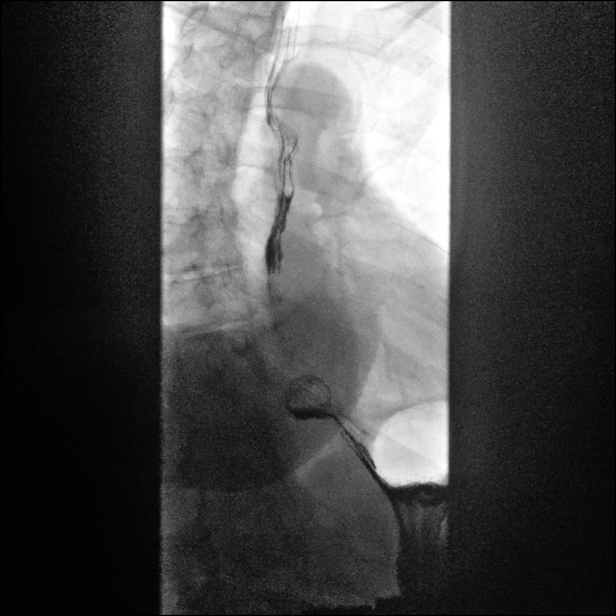
[im 2/6]
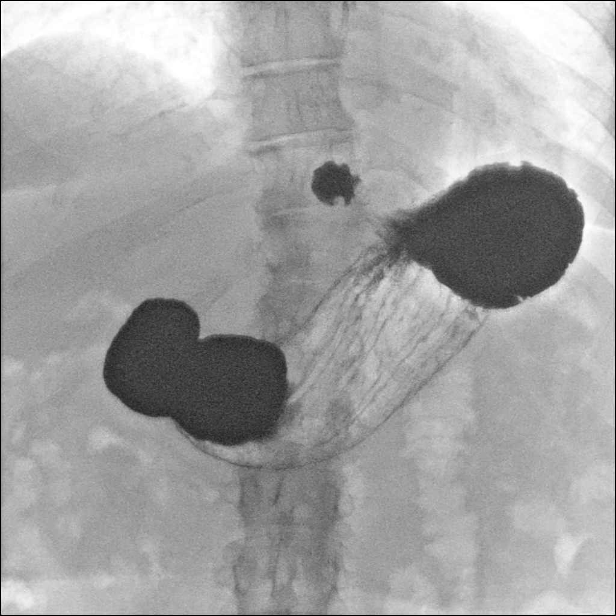
[im 5/6]
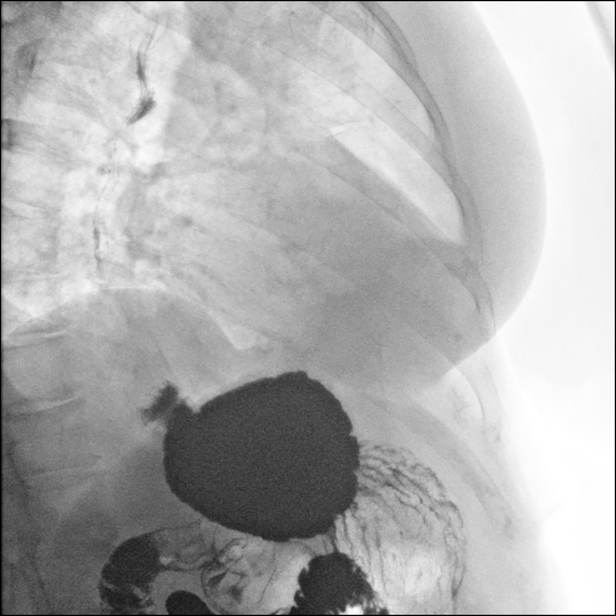

[Series 4: sequence · 3 of 31 frames shown (1 of 2)]
[frame 4/31]
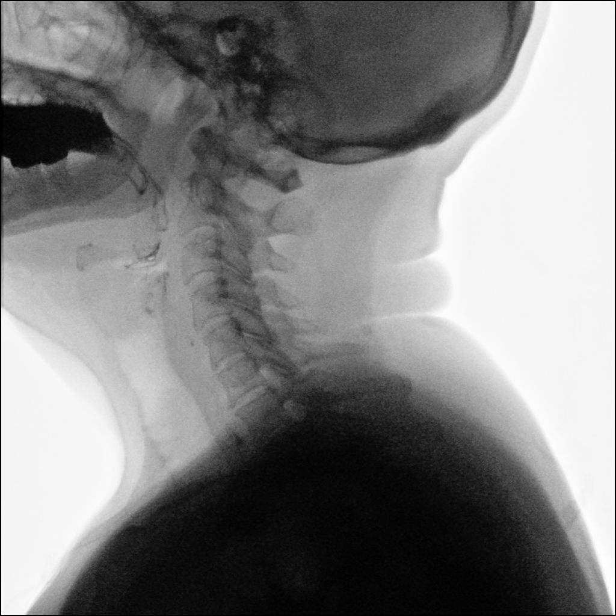
[frame 5/31]
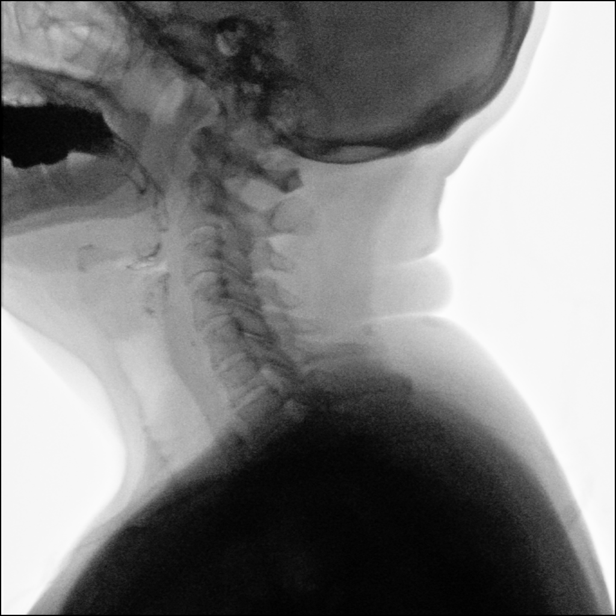
[frame 27/31]
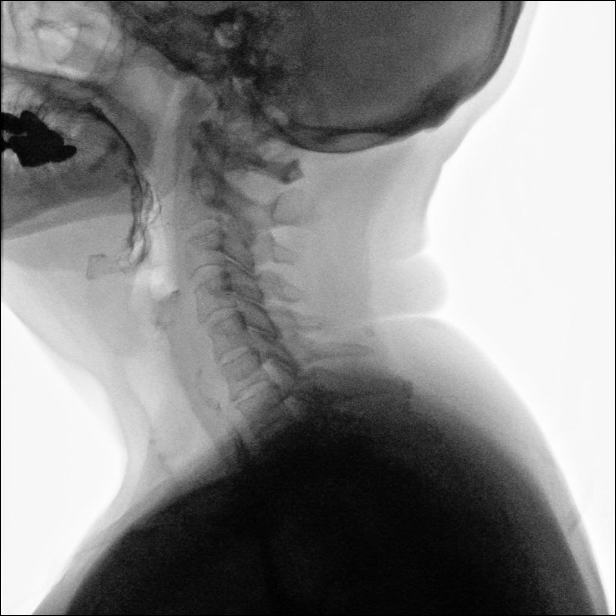

[Series 5: one shot · 2 of 5 slices shown (3 of 4)]
[im 3/5]
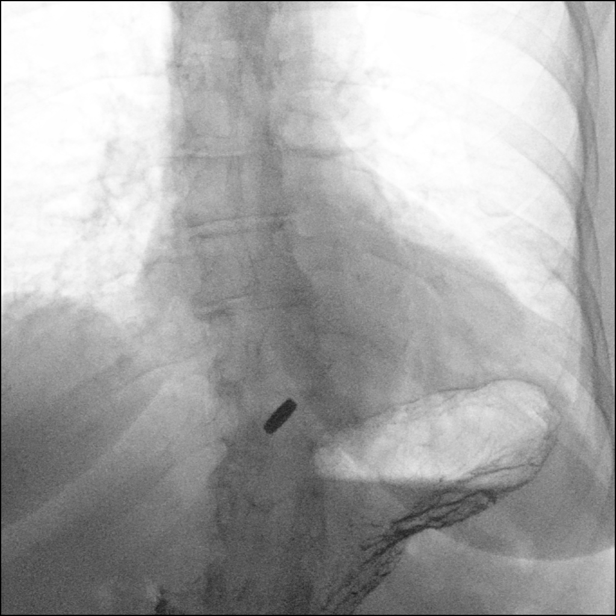
[im 5/5]
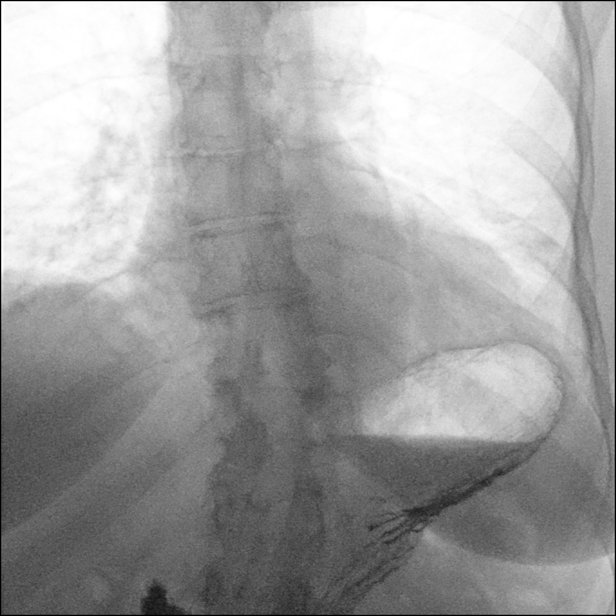

[Series 6: sequence · 1 of 6 frames shown (2 of 2)]
[frame 1/6]
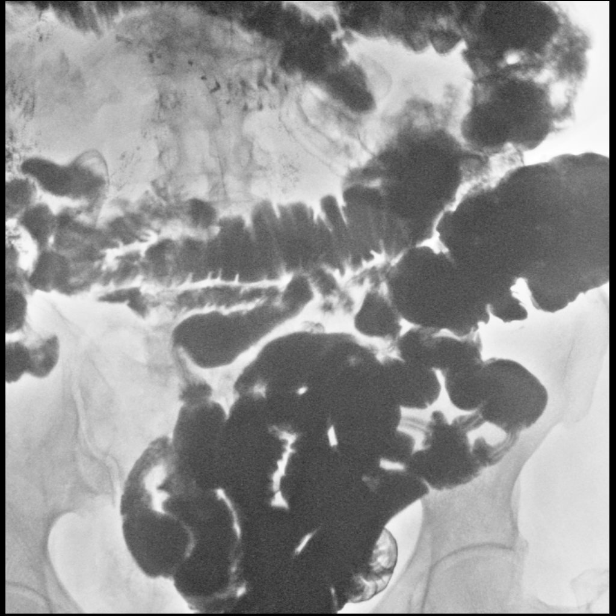

[Series 7: one shot · 2 of 3 slices shown (4 of 4)]
[im 1/3]
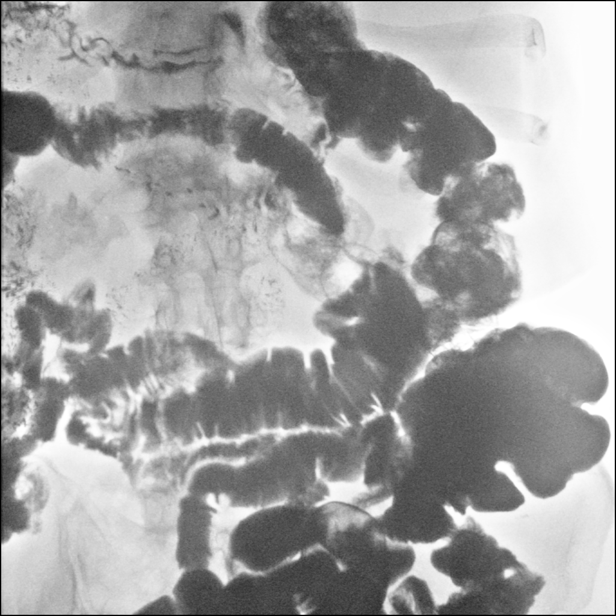
[im 3/3]
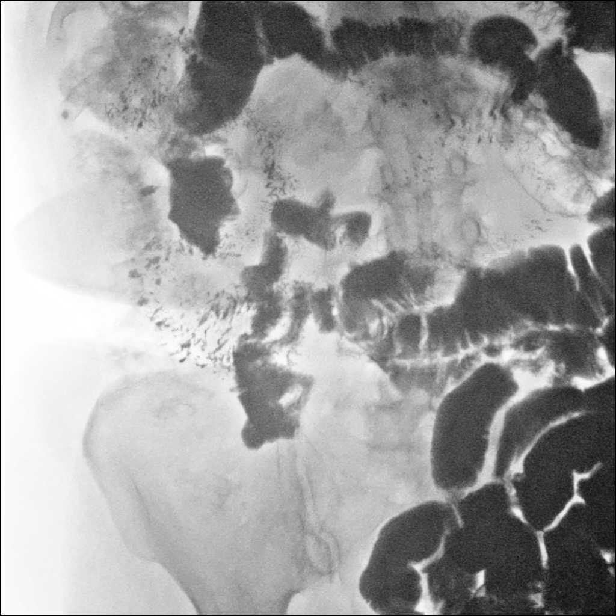

[14 of 24 positions shown; findings below may reference images not displayed]

FINDINGS: The swallowing mechanism is unremarkable.

The esophagus has a normal course and caliber. There is no stricture
or mass identified. A 13 mm barium tablet was ingested which easily
passed through the esophagus and into the stomach. Mild esophageal
dysmotility with non propulsive tertiary waves noted within the mid
and distal esophagus. A small hiatal hernia identified. Mild reflux.

The stomach, and duodenal bulb appear unremarkable. Diverticulum
arises from the descending portion of the duodenum.

Normal small bowel transit time. The small bowel fold pattern is
unremarkable. No abnormal small bowel distention.
IMPRESSION: 1. Small hiatal hernia.  Mild reflux noted.
2. Mild esophageal dysmotility.
3. Duodenal diverticulum noted.

## 2021-11-11 DIAGNOSIS — Z23 Encounter for immunization: Secondary | ICD-10-CM | POA: Diagnosis not present

## 2021-11-26 DIAGNOSIS — N390 Urinary tract infection, site not specified: Secondary | ICD-10-CM | POA: Diagnosis not present

## 2021-11-26 DIAGNOSIS — R350 Frequency of micturition: Secondary | ICD-10-CM | POA: Diagnosis not present

## 2021-12-12 DIAGNOSIS — L309 Dermatitis, unspecified: Secondary | ICD-10-CM | POA: Diagnosis not present

## 2021-12-12 DIAGNOSIS — L719 Rosacea, unspecified: Secondary | ICD-10-CM | POA: Diagnosis not present

## 2022-01-19 DIAGNOSIS — R350 Frequency of micturition: Secondary | ICD-10-CM | POA: Diagnosis not present

## 2022-01-19 DIAGNOSIS — N390 Urinary tract infection, site not specified: Secondary | ICD-10-CM | POA: Diagnosis not present

## 2022-02-13 DIAGNOSIS — H43393 Other vitreous opacities, bilateral: Secondary | ICD-10-CM | POA: Diagnosis not present

## 2022-02-13 DIAGNOSIS — D3132 Benign neoplasm of left choroid: Secondary | ICD-10-CM | POA: Diagnosis not present

## 2022-02-13 DIAGNOSIS — Z961 Presence of intraocular lens: Secondary | ICD-10-CM | POA: Diagnosis not present

## 2022-02-13 DIAGNOSIS — H35722 Serous detachment of retinal pigment epithelium, left eye: Secondary | ICD-10-CM | POA: Diagnosis not present

## 2022-02-15 DIAGNOSIS — Z23 Encounter for immunization: Secondary | ICD-10-CM | POA: Diagnosis not present

## 2022-02-20 DIAGNOSIS — L578 Other skin changes due to chronic exposure to nonionizing radiation: Secondary | ICD-10-CM | POA: Diagnosis not present

## 2022-02-20 DIAGNOSIS — L720 Epidermal cyst: Secondary | ICD-10-CM | POA: Diagnosis not present

## 2022-02-20 DIAGNOSIS — L570 Actinic keratosis: Secondary | ICD-10-CM | POA: Diagnosis not present

## 2022-02-20 DIAGNOSIS — L814 Other melanin hyperpigmentation: Secondary | ICD-10-CM | POA: Diagnosis not present

## 2022-02-20 DIAGNOSIS — L821 Other seborrheic keratosis: Secondary | ICD-10-CM | POA: Diagnosis not present

## 2022-02-20 DIAGNOSIS — D225 Melanocytic nevi of trunk: Secondary | ICD-10-CM | POA: Diagnosis not present

## 2022-02-20 DIAGNOSIS — L820 Inflamed seborrheic keratosis: Secondary | ICD-10-CM | POA: Diagnosis not present

## 2022-02-20 DIAGNOSIS — Z85828 Personal history of other malignant neoplasm of skin: Secondary | ICD-10-CM | POA: Diagnosis not present

## 2022-04-25 DIAGNOSIS — I872 Venous insufficiency (chronic) (peripheral): Secondary | ICD-10-CM | POA: Diagnosis not present

## 2022-04-25 DIAGNOSIS — I87393 Chronic venous hypertension (idiopathic) with other complications of bilateral lower extremity: Secondary | ICD-10-CM | POA: Diagnosis not present

## 2022-05-17 DIAGNOSIS — I83811 Varicose veins of right lower extremities with pain: Secondary | ICD-10-CM | POA: Diagnosis not present

## 2022-05-17 DIAGNOSIS — I83891 Varicose veins of right lower extremities with other complications: Secondary | ICD-10-CM | POA: Diagnosis not present

## 2022-05-29 DIAGNOSIS — J309 Allergic rhinitis, unspecified: Secondary | ICD-10-CM | POA: Diagnosis not present

## 2022-05-29 DIAGNOSIS — Z79899 Other long term (current) drug therapy: Secondary | ICD-10-CM | POA: Diagnosis not present

## 2022-06-01 DIAGNOSIS — I83891 Varicose veins of right lower extremities with other complications: Secondary | ICD-10-CM | POA: Diagnosis not present

## 2022-06-07 DIAGNOSIS — E785 Hyperlipidemia, unspecified: Secondary | ICD-10-CM | POA: Diagnosis not present

## 2022-06-07 DIAGNOSIS — J309 Allergic rhinitis, unspecified: Secondary | ICD-10-CM | POA: Diagnosis not present

## 2022-06-07 DIAGNOSIS — M8580 Other specified disorders of bone density and structure, unspecified site: Secondary | ICD-10-CM | POA: Diagnosis not present

## 2022-06-07 DIAGNOSIS — Z0000 Encounter for general adult medical examination without abnormal findings: Secondary | ICD-10-CM | POA: Diagnosis not present

## 2022-06-07 DIAGNOSIS — Q401 Congenital hiatus hernia: Secondary | ICD-10-CM | POA: Diagnosis not present

## 2022-06-19 DIAGNOSIS — Z780 Asymptomatic menopausal state: Secondary | ICD-10-CM | POA: Diagnosis not present

## 2022-06-19 DIAGNOSIS — M8589 Other specified disorders of bone density and structure, multiple sites: Secondary | ICD-10-CM | POA: Diagnosis not present

## 2022-06-19 DIAGNOSIS — Z1231 Encounter for screening mammogram for malignant neoplasm of breast: Secondary | ICD-10-CM | POA: Diagnosis not present

## 2022-08-08 DIAGNOSIS — W57XXXA Bitten or stung by nonvenomous insect and other nonvenomous arthropods, initial encounter: Secondary | ICD-10-CM | POA: Diagnosis not present

## 2022-08-08 DIAGNOSIS — S40262A Insect bite (nonvenomous) of left shoulder, initial encounter: Secondary | ICD-10-CM | POA: Diagnosis not present

## 2022-08-15 DIAGNOSIS — I87393 Chronic venous hypertension (idiopathic) with other complications of bilateral lower extremity: Secondary | ICD-10-CM | POA: Diagnosis not present

## 2022-08-15 DIAGNOSIS — I872 Venous insufficiency (chronic) (peripheral): Secondary | ICD-10-CM | POA: Diagnosis not present

## 2022-08-16 DIAGNOSIS — L814 Other melanin hyperpigmentation: Secondary | ICD-10-CM | POA: Diagnosis not present

## 2022-08-16 DIAGNOSIS — L570 Actinic keratosis: Secondary | ICD-10-CM | POA: Diagnosis not present

## 2022-08-16 DIAGNOSIS — D485 Neoplasm of uncertain behavior of skin: Secondary | ICD-10-CM | POA: Diagnosis not present

## 2022-08-16 DIAGNOSIS — D0439 Carcinoma in situ of skin of other parts of face: Secondary | ICD-10-CM | POA: Diagnosis not present

## 2022-08-16 DIAGNOSIS — L719 Rosacea, unspecified: Secondary | ICD-10-CM | POA: Diagnosis not present

## 2022-10-05 DIAGNOSIS — D0439 Carcinoma in situ of skin of other parts of face: Secondary | ICD-10-CM | POA: Diagnosis not present

## 2022-12-11 DIAGNOSIS — L570 Actinic keratosis: Secondary | ICD-10-CM | POA: Diagnosis not present

## 2023-01-05 DIAGNOSIS — N390 Urinary tract infection, site not specified: Secondary | ICD-10-CM | POA: Diagnosis not present

## 2023-01-05 DIAGNOSIS — R300 Dysuria: Secondary | ICD-10-CM | POA: Diagnosis not present

## 2023-02-13 DIAGNOSIS — Z23 Encounter for immunization: Secondary | ICD-10-CM | POA: Diagnosis not present

## 2023-02-19 DIAGNOSIS — D3132 Benign neoplasm of left choroid: Secondary | ICD-10-CM | POA: Diagnosis not present

## 2023-02-19 DIAGNOSIS — Z961 Presence of intraocular lens: Secondary | ICD-10-CM | POA: Diagnosis not present

## 2023-02-19 DIAGNOSIS — H43393 Other vitreous opacities, bilateral: Secondary | ICD-10-CM | POA: Diagnosis not present

## 2023-02-19 DIAGNOSIS — H35722 Serous detachment of retinal pigment epithelium, left eye: Secondary | ICD-10-CM | POA: Diagnosis not present

## 2023-03-02 DIAGNOSIS — N390 Urinary tract infection, site not specified: Secondary | ICD-10-CM | POA: Diagnosis not present

## 2023-03-02 DIAGNOSIS — R350 Frequency of micturition: Secondary | ICD-10-CM | POA: Diagnosis not present

## 2023-03-19 DIAGNOSIS — D485 Neoplasm of uncertain behavior of skin: Secondary | ICD-10-CM | POA: Diagnosis not present

## 2023-03-19 DIAGNOSIS — D0461 Carcinoma in situ of skin of right upper limb, including shoulder: Secondary | ICD-10-CM | POA: Diagnosis not present

## 2023-03-19 DIAGNOSIS — Z85828 Personal history of other malignant neoplasm of skin: Secondary | ICD-10-CM | POA: Diagnosis not present

## 2023-03-19 DIAGNOSIS — D225 Melanocytic nevi of trunk: Secondary | ICD-10-CM | POA: Diagnosis not present

## 2023-03-19 DIAGNOSIS — L814 Other melanin hyperpigmentation: Secondary | ICD-10-CM | POA: Diagnosis not present

## 2023-03-19 DIAGNOSIS — L821 Other seborrheic keratosis: Secondary | ICD-10-CM | POA: Diagnosis not present

## 2023-03-19 DIAGNOSIS — L570 Actinic keratosis: Secondary | ICD-10-CM | POA: Diagnosis not present

## 2023-03-19 DIAGNOSIS — L578 Other skin changes due to chronic exposure to nonionizing radiation: Secondary | ICD-10-CM | POA: Diagnosis not present

## 2023-03-28 DIAGNOSIS — J0140 Acute pansinusitis, unspecified: Secondary | ICD-10-CM | POA: Diagnosis not present

## 2023-04-12 DIAGNOSIS — D0461 Carcinoma in situ of skin of right upper limb, including shoulder: Secondary | ICD-10-CM | POA: Diagnosis not present

## 2023-04-12 DIAGNOSIS — L905 Scar conditions and fibrosis of skin: Secondary | ICD-10-CM | POA: Diagnosis not present

## 2023-06-04 DIAGNOSIS — J300 Vasomotor rhinitis: Secondary | ICD-10-CM | POA: Diagnosis not present

## 2023-06-04 DIAGNOSIS — E785 Hyperlipidemia, unspecified: Secondary | ICD-10-CM | POA: Diagnosis not present

## 2023-06-04 DIAGNOSIS — M8580 Other specified disorders of bone density and structure, unspecified site: Secondary | ICD-10-CM | POA: Diagnosis not present

## 2023-06-04 DIAGNOSIS — Z79899 Other long term (current) drug therapy: Secondary | ICD-10-CM | POA: Diagnosis not present

## 2023-06-11 DIAGNOSIS — Q401 Congenital hiatus hernia: Secondary | ICD-10-CM | POA: Diagnosis not present

## 2023-06-11 DIAGNOSIS — Z0001 Encounter for general adult medical examination with abnormal findings: Secondary | ICD-10-CM | POA: Diagnosis not present

## 2023-06-11 DIAGNOSIS — E785 Hyperlipidemia, unspecified: Secondary | ICD-10-CM | POA: Diagnosis not present

## 2023-06-11 DIAGNOSIS — M8580 Other specified disorders of bone density and structure, unspecified site: Secondary | ICD-10-CM | POA: Diagnosis not present

## 2023-06-11 DIAGNOSIS — J309 Allergic rhinitis, unspecified: Secondary | ICD-10-CM | POA: Diagnosis not present

## 2023-06-25 DIAGNOSIS — Z1231 Encounter for screening mammogram for malignant neoplasm of breast: Secondary | ICD-10-CM | POA: Diagnosis not present

## 2023-08-09 ENCOUNTER — Encounter (INDEPENDENT_AMBULATORY_CARE_PROVIDER_SITE_OTHER): Payer: Self-pay | Admitting: *Deleted

## 2023-09-04 DIAGNOSIS — L719 Rosacea, unspecified: Secondary | ICD-10-CM | POA: Diagnosis not present

## 2023-09-04 DIAGNOSIS — L820 Inflamed seborrheic keratosis: Secondary | ICD-10-CM | POA: Diagnosis not present

## 2023-11-19 DIAGNOSIS — N390 Urinary tract infection, site not specified: Secondary | ICD-10-CM | POA: Diagnosis not present

## 2023-11-19 DIAGNOSIS — R300 Dysuria: Secondary | ICD-10-CM | POA: Diagnosis not present
